# Patient Record
Sex: Female | Born: 1960 | Race: White | Hispanic: No | Marital: Married | Smoking: Former smoker
Health system: Southern US, Community
[De-identification: ages and names within clinical notes are randomized; demographics above are authoritative.]

## PROBLEM LIST (undated history)

## (undated) DIAGNOSIS — M5090 Cervical disc disorder, unspecified, unspecified cervical region: Secondary | ICD-10-CM

## (undated) DIAGNOSIS — K219 Gastro-esophageal reflux disease without esophagitis: Secondary | ICD-10-CM

## (undated) DIAGNOSIS — E039 Hypothyroidism, unspecified: Secondary | ICD-10-CM

## (undated) DIAGNOSIS — E785 Hyperlipidemia, unspecified: Secondary | ICD-10-CM

## (undated) HISTORY — DX: Gastro-esophageal reflux disease without esophagitis: K21.9

## (undated) HISTORY — PX: COLONOSCOPY: SHX174

## (undated) HISTORY — PX: CHOLECYSTECTOMY: SHX55

## (undated) HISTORY — DX: Cervical disc disorder, unspecified, unspecified cervical region: M50.90

## (undated) HISTORY — DX: Hyperlipidemia, unspecified: E78.5

---

## 2012-12-30 DIAGNOSIS — J329 Chronic sinusitis, unspecified: Secondary | ICD-10-CM | POA: Insufficient documentation

## 2014-07-20 ENCOUNTER — Ambulatory Visit (HOSPITAL_COMMUNITY)
Admission: RE | Admit: 2014-07-20 | Discharge: 2014-07-20 | Disposition: A | Discharge status: Home / Self Care | Payer: 59 | Source: Ambulatory Visit | Attending: Surgery | Admitting: Surgery

## 2014-07-20 ENCOUNTER — Other Ambulatory Visit (HOSPITAL_COMMUNITY): Payer: Self-pay | Admitting: Surgery

## 2014-07-20 DIAGNOSIS — M7989 Other specified soft tissue disorders: Secondary | ICD-10-CM | POA: Diagnosis not present

## 2014-07-20 DIAGNOSIS — M25569 Pain in unspecified knee: Secondary | ICD-10-CM | POA: Diagnosis present

## 2014-07-20 DIAGNOSIS — M79609 Pain in unspecified limb: Secondary | ICD-10-CM

## 2014-07-20 DIAGNOSIS — M25562 Pain in left knee: Secondary | ICD-10-CM

## 2015-12-26 HISTORY — PX: GALLBLADDER SURGERY: SHX652

## 2016-02-04 ENCOUNTER — Other Ambulatory Visit: Payer: Self-pay | Admitting: Family Medicine

## 2016-02-04 DIAGNOSIS — M542 Cervicalgia: Secondary | ICD-10-CM

## 2016-02-09 ENCOUNTER — Ambulatory Visit
Admission: RE | Admit: 2016-02-09 | Discharge: 2016-02-09 | Disposition: A | Discharge status: Home / Self Care | Payer: 59 | Source: Ambulatory Visit | Attending: Family Medicine | Admitting: Family Medicine

## 2016-02-09 DIAGNOSIS — M542 Cervicalgia: Secondary | ICD-10-CM

## 2016-11-27 ENCOUNTER — Other Ambulatory Visit: Payer: Self-pay | Admitting: General Surgery

## 2016-12-26 DIAGNOSIS — K8020 Calculus of gallbladder without cholecystitis without obstruction: Secondary | ICD-10-CM | POA: Diagnosis not present

## 2017-04-19 DIAGNOSIS — H5213 Myopia, bilateral: Secondary | ICD-10-CM | POA: Diagnosis not present

## 2017-04-19 DIAGNOSIS — H52223 Regular astigmatism, bilateral: Secondary | ICD-10-CM | POA: Diagnosis not present

## 2017-04-19 DIAGNOSIS — H16143 Punctate keratitis, bilateral: Secondary | ICD-10-CM | POA: Diagnosis not present

## 2017-05-30 DIAGNOSIS — J069 Acute upper respiratory infection, unspecified: Secondary | ICD-10-CM | POA: Diagnosis not present

## 2017-05-30 DIAGNOSIS — L089 Local infection of the skin and subcutaneous tissue, unspecified: Secondary | ICD-10-CM | POA: Diagnosis not present

## 2017-07-11 DIAGNOSIS — D225 Melanocytic nevi of trunk: Secondary | ICD-10-CM | POA: Diagnosis not present

## 2017-07-11 DIAGNOSIS — D2262 Melanocytic nevi of left upper limb, including shoulder: Secondary | ICD-10-CM | POA: Diagnosis not present

## 2017-07-11 DIAGNOSIS — D1801 Hemangioma of skin and subcutaneous tissue: Secondary | ICD-10-CM | POA: Diagnosis not present

## 2017-07-11 DIAGNOSIS — D485 Neoplasm of uncertain behavior of skin: Secondary | ICD-10-CM | POA: Diagnosis not present

## 2017-07-11 DIAGNOSIS — D2261 Melanocytic nevi of right upper limb, including shoulder: Secondary | ICD-10-CM | POA: Diagnosis not present

## 2017-10-03 DIAGNOSIS — E039 Hypothyroidism, unspecified: Secondary | ICD-10-CM | POA: Diagnosis not present

## 2017-10-03 DIAGNOSIS — Z1211 Encounter for screening for malignant neoplasm of colon: Secondary | ICD-10-CM | POA: Diagnosis not present

## 2017-10-03 DIAGNOSIS — R7309 Other abnormal glucose: Secondary | ICD-10-CM | POA: Diagnosis not present

## 2017-10-03 DIAGNOSIS — R140 Abdominal distension (gaseous): Secondary | ICD-10-CM | POA: Diagnosis not present

## 2017-11-29 ENCOUNTER — Encounter: Payer: Self-pay | Admitting: Family Medicine

## 2017-12-12 DIAGNOSIS — E039 Hypothyroidism, unspecified: Secondary | ICD-10-CM | POA: Diagnosis not present

## 2018-09-10 ENCOUNTER — Other Ambulatory Visit: Payer: Self-pay | Admitting: Family Medicine

## 2018-09-10 DIAGNOSIS — Z1231 Encounter for screening mammogram for malignant neoplasm of breast: Secondary | ICD-10-CM

## 2018-10-11 DIAGNOSIS — Z1231 Encounter for screening mammogram for malignant neoplasm of breast: Secondary | ICD-10-CM | POA: Diagnosis not present

## 2018-10-24 DIAGNOSIS — R928 Other abnormal and inconclusive findings on diagnostic imaging of breast: Secondary | ICD-10-CM | POA: Diagnosis not present

## 2018-10-24 DIAGNOSIS — N6315 Unspecified lump in the right breast, overlapping quadrants: Secondary | ICD-10-CM | POA: Diagnosis not present

## 2018-10-24 DIAGNOSIS — N6311 Unspecified lump in the right breast, upper outer quadrant: Secondary | ICD-10-CM | POA: Diagnosis not present

## 2018-10-28 DIAGNOSIS — R142 Eructation: Secondary | ICD-10-CM | POA: Diagnosis not present

## 2018-10-28 DIAGNOSIS — Z0000 Encounter for general adult medical examination without abnormal findings: Secondary | ICD-10-CM | POA: Diagnosis not present

## 2018-10-28 DIAGNOSIS — Z136 Encounter for screening for cardiovascular disorders: Secondary | ICD-10-CM | POA: Diagnosis not present

## 2018-10-29 DIAGNOSIS — N6091 Unspecified benign mammary dysplasia of right breast: Secondary | ICD-10-CM | POA: Diagnosis not present

## 2018-10-29 DIAGNOSIS — D241 Benign neoplasm of right breast: Secondary | ICD-10-CM | POA: Diagnosis not present

## 2018-10-29 DIAGNOSIS — R928 Other abnormal and inconclusive findings on diagnostic imaging of breast: Secondary | ICD-10-CM | POA: Diagnosis not present

## 2018-10-29 DIAGNOSIS — N6011 Diffuse cystic mastopathy of right breast: Secondary | ICD-10-CM | POA: Diagnosis not present

## 2018-10-29 DIAGNOSIS — N6315 Unspecified lump in the right breast, overlapping quadrants: Secondary | ICD-10-CM | POA: Diagnosis not present

## 2018-11-06 ENCOUNTER — Encounter: Payer: Self-pay | Admitting: Gastroenterology

## 2018-11-11 ENCOUNTER — Ambulatory Visit: Payer: Self-pay | Admitting: Surgery

## 2018-11-11 DIAGNOSIS — N6310 Unspecified lump in the right breast, unspecified quadrant: Secondary | ICD-10-CM | POA: Diagnosis not present

## 2018-11-11 DIAGNOSIS — D241 Benign neoplasm of right breast: Secondary | ICD-10-CM | POA: Diagnosis not present

## 2018-11-11 NOTE — H&P (Signed)
Oswaldo Done Documented: 11/11/2018 11:45 AM Location: [facility] Patient #: [id number] DOB: 1961/05/03 Married / Language: Cleophus Molt / Race: White Female  History of Present Illness Marcello Moores A. Chrisanne Loose MD; 11/11/2018 12:16 PM) Patient words: Patient sent at the request of Dr. Arna Snipe for abnormal mammogram. She underwent screening mammogram via [facility] that showed 2 areas to the right breast both were core biopsy. 1 showed radial scar the second showed fibroadenoma. She denies any history of breast pain, nipple discharge or the problem with either breast. She has no family history of breast cancer.  The patient is a 57 year old female.   Allergies (Tanisha A. Owens Shark, [place]; 11/11/2018 11:46 AM) No Known Drug Allergies [11/27/2016]: Allergies Reconciled  Medication History (Tanisha A. Owens Shark, [place]; 11/11/2018 11:46 AM) Omeprazole (20MG  Capsule DR, Oral) Active. Levothyroxine Sodium (175MCG Tablet, Oral) Active. Medications Reconciled    Vitals (Tanisha A. Brown RMA; 11/11/2018 11:46 AM) 11/11/2018 11:45 AM Weight: 213.8 lb Height: 67in Body Surface Area: 2.08 m Body Mass Index: 33.49 kg/m  Temp.: 98.44F  Pulse: 94 (Regular)  BP: 122/84 (Sitting, Left Arm, Standard)      Physical Exam (Khilynn Borntreger A. Donnice Nielsen MD; 11/11/2018 12:16 PM)  General Mental Status-Alert. General Appearance-Consistent with stated age. Hydration-Well hydrated. Voice-Normal.  Chest and Lung Exam Chest and lung exam reveals -quiet, even and easy respiratory effort with no use of accessory muscles and on auscultation, normal breath sounds, no adventitious sounds and normal vocal resonance. Inspection Chest Wall - Normal. Back - normal.  Breast Breast - Left-Symmetric, Non Tender, No Biopsy scars, no Dimpling, No Inflammation, No Lumpectomy scars, No Mastectomy scars, No Peau d' Orange. Breast - Right-Symmetric, Non Tender, No Biopsy scars, no Dimpling, No  Inflammation, No Lumpectomy scars, No Mastectomy scars, No Peau d' Orange. Breast Lump-No Palpable Breast Mass. Note: Mild bruising medial right breast  Neurologic Neurologic evaluation reveals -alert and oriented x 3 with no impairment of recent or remote memory. Mental Status-Normal.  Musculoskeletal Normal Exam - Left-Upper Extremity Strength Normal and Lower Extremity Strength Normal. Normal Exam - Right-Upper Extremity Strength Normal and Lower Extremity Strength Normal.  Lymphatic Head & Neck  General Head & Neck Lymphatics: Bilateral - Description - Normal. Axillary  General Axillary Region: Bilateral - Description - Normal. Tenderness - Non Tender.    Assessment & Plan (Jamine Highfill A. Kerissa Coia MD; 11/11/2018 12:17 PM)  MASS OF RIGHT BREAST ON MAMMOGRAM (N63.10) Impression: Radial scar  Discussed options of observation versus excision in the guidelines recommendations for excision of radial scar. Discussed potential upgrade risk of one and 5 for radial scar after core biopsy. She has opted for right breast seed lumpectomy. Risk of lumpectomy include bleeding, infection, seroma, more surgery, use of seed/wire, wound care, cosmetic deformity and the need for other treatments, death , blood clots, death. Pt agrees to proceed.   Patient also desires removal of the right breast fibroadenoma the same time.   BREAST FIBROADENOMA, RIGHT (D24.1) Impression: Risk of lumpectomy include bleeding, infection, seroma, more surgery, use of seed/wire, wound care, cosmetic deformity and the need for other treatments, death , blood clots, death. Pt agrees to proceed.  Current Plans You are being scheduled for surgery- Our schedulers will call you.  You should hear from our office's scheduling department within 5 working days about the location, date, and time of surgery. We try to make accommodations for patient's preferences in scheduling surgery, but sometimes the OR schedule or  the surgeon's schedule prevents [place] from making  those accommodations.  If you have not heard from our office [phone]) in 5 working days, call the office and ask for your surgeon's nurse.  If you have other questions about your diagnosis, plan, or surgery, call the office and ask for your surgeon's nurse.  Pt Education - Pamphlet Given - Breast Biopsy: discussed with patient and provided information.

## 2018-11-28 DIAGNOSIS — H52223 Regular astigmatism, bilateral: Secondary | ICD-10-CM | POA: Diagnosis not present

## 2018-11-28 DIAGNOSIS — H524 Presbyopia: Secondary | ICD-10-CM | POA: Diagnosis not present

## 2018-11-28 DIAGNOSIS — H5213 Myopia, bilateral: Secondary | ICD-10-CM | POA: Diagnosis not present

## 2018-11-29 ENCOUNTER — Other Ambulatory Visit: Payer: Self-pay | Admitting: Surgery

## 2018-11-29 DIAGNOSIS — N6310 Unspecified lump in the right breast, unspecified quadrant: Secondary | ICD-10-CM

## 2018-12-06 NOTE — Progress Notes (Signed)
New patient paperwork 

## 2018-12-10 ENCOUNTER — Telehealth: Payer: Self-pay

## 2018-12-10 ENCOUNTER — Ambulatory Visit: Payer: 59 | Admitting: Gastroenterology

## 2018-12-10 ENCOUNTER — Encounter: Payer: Self-pay | Admitting: Gastroenterology

## 2018-12-10 VITALS — BP 122/78 | HR 89 | Ht 67.0 in | Wt 214.6 lb

## 2018-12-10 DIAGNOSIS — R140 Abdominal distension (gaseous): Secondary | ICD-10-CM

## 2018-12-10 DIAGNOSIS — K219 Gastro-esophageal reflux disease without esophagitis: Secondary | ICD-10-CM | POA: Diagnosis not present

## 2018-12-10 DIAGNOSIS — Z1211 Encounter for screening for malignant neoplasm of colon: Secondary | ICD-10-CM | POA: Diagnosis not present

## 2018-12-10 DIAGNOSIS — R194 Change in bowel habit: Secondary | ICD-10-CM | POA: Diagnosis not present

## 2018-12-10 MED ORDER — COLESTIPOL HCL 1 G PO TABS: 1.0000 g | ORAL_TABLET | Freq: Two times a day (BID) | ORAL | 3 refills | Status: DC

## 2018-12-10 MED ORDER — OMEPRAZOLE 20 MG PO CPDR: 20.0000 mg | DELAYED_RELEASE_CAPSULE | Freq: Two times a day (BID) | ORAL | 5 refills | Status: DC

## 2018-12-10 MED ORDER — OMEPRAZOLE 20 MG PO CPDR: 20.0000 mg | DELAYED_RELEASE_CAPSULE | Freq: Every day | ORAL | 5 refills | Status: DC

## 2018-12-10 NOTE — Progress Notes (Signed)
HPI :  57 y/o female with a history of thyroid disorder, breast lump, GERD, referred her by Dr. Marjory Lies Marrow for discussion of colon cancer screening and GERD.  She states she had a colonoscopy on 11/05/2012, done at [facility]. Report is available for review today. She had an excellent bowel preparation. There was multiple 1-2 mm polyps in the rectosigmoid colon which were removed with cold forceps and consistent with hyperplastic polyps. Moderate internal hemorrhoids noted with small diverticula in the sigmoid colon. Exam was otherwise normal. No adenomas. She was told to consider repeat colonoscopy in 5 years. She denies any family history of colon cancer. The report on the [facility] colonoscopy suggests a family history of colon polyps, she denies any high-risk polyps in her family that she is aware of. She's had some bowel habit changes since her gallbladder was removed in January 2018. She normally has a bowel movement once a day however after eating fried or greasy foods she can have some urgency with her stools. She denies any blood in her stools. She does have some abdominal bloating and gas with distention at times. She can feel this both after eating and when she is not eating.  She otherwise endorses some reflux symptoms that have bothered her over the past 6 months. Symptoms of regurgitation and belching are the main symptoms with some acid taste in the mouth, otherwise no significant pyrosis. No upper abdominal pain or nausea or vomiting. No dysphagia. She was given a trial of omeprazole 20 mg once a day. That has improved her symptoms although she continues to have some reflux which continues to bother her at times. She never had a prior endoscopy. She denies any family history of esophageal cancer. She has a 10 year tobacco history but has quit and no longer smoking.  Of note she's had an abnormal mammogram which is leading to lumpectomy scheduled for January 7.    Past Medical History:  Diagnosis  Date  . Cervical disc disease   . GERD (gastroesophageal reflux disease)   . Hyperlipidemia   . Hyperthyroidism      Past Surgical History:  Procedure Laterality Date  . GALLBLADDER SURGERY     Family History  Problem Relation Age of Onset  . Uterine cancer Mother    Social History   Tobacco Use  . Smoking status: Former Research scientist (life sciences)  . Smokeless tobacco: Never Used  Substance Use Topics  . Alcohol use: Yes  . Drug use: Never   Current Outpatient Medications  Medication Sig Dispense Refill  . levothyroxine (SYNTHROID, LEVOTHROID) 150 MCG tablet Take 175 mcg by mouth daily before breakfast.     . omeprazole (PRILOSEC) 20 MG capsule Take 1 capsule (20 mg total) by mouth 2 (two) times daily. 60 capsule 5  . colestipol (COLESTID) 1 g tablet Take 1 tablet (1 g total) by mouth 2 (two) times daily. 60 tablet 3   No current facility-administered medications for this visit.    No Known Allergies   Review of Systems: All systems reviewed and negative except where noted in HPI.    No results found for: WBC, HGB, HCT, MCV, PLT  No results found for: CREATININE, BUN, NA, K, CL, CO2   Physical Exam: BP 122/78   Pulse 89   Ht 5\' 7"  (1.702 m)   Wt 214 lb 9.6 oz (97.3 kg)   SpO2 99%   BMI 33.61 kg/m  Constitutional: Pleasant,well-developed, female in no acute distress. HEENT: Normocephalic and atraumatic. Conjunctivae  are normal. No scleral icterus. Neck supple.  Cardiovascular: Normal rate, regular rhythm.  Pulmonary/chest: Effort normal and breath sounds normal. No wheezing, rales or rhonchi. Abdominal: Soft, nondistended, nontender.  There are no masses palpable. No hepatomegaly. Extremities: no edema Lymphadenopathy: No cervical adenopathy noted. Neurological: Alert and oriented to person place and time. Skin: Skin is warm and dry. No rashes noted. Psychiatric: Normal mood and affect. Behavior is normal.   ASSESSMENT AND PLAN: 56 year old female here for new patient  evaluation regarding following issues:  GERD - relatively recent development of symptoms within the past 6 months. On omeprazole 20 mg once a day which has helped considerably, having some breakthrough occasionally. Discussed options with her. Think a trial of increasing omeprazole to twice a day for a few weeks is reasonable to see if we can resolve her symptoms. If they continue to persist we can consider upper endoscopy. Long-term recommend the lowest dose of PPI needed to control her symptoms. She can decrease her dose as tolerated moving forward pending her course.   Bloating / altered bowel habits - she's had some urgency of her stools with loose stools since having her gallbladder out. 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 I offered her a trial of Colestid 1 g twice daily to see if that helps. She will need to separate this from her Synthroid dosing. Her triglyceride level is normal, I think she'll tolerate it. Otherwise discussed pathophysiology of intestinal gas and bloating. I recommended a trial of a low FODMAP diet and provided a handout regarding that. Bloating persists may consider a trial of probiotic or antibiotic. She agreed.  Colon cancer screening - colonoscopy in 10/2012 with benign rectosigmoid hyperplastic polyps, excellent prep, no adenomas. She denies any family history of colon polyps or colon cancer. In this light I do not feel like she warrants a colonoscopy until 10/2022. The copy of this colonoscopy report was not available until after she left the office. She will be contacted with this recommendation.   She can otherwise follow up as needed for the issues as outlined above.   Arden Cellar, MD [facility]   CC: Dr. Marjory Lies Marrow

## 2018-12-10 NOTE — Telephone Encounter (Signed)
[facility] sent a rejection of the script we sent for Omeprazole 20mg , BID.  Plan will only approve once a day.  Called and spoke to pt.  Since she is only trying BID and it may not be a long term change she agreed to supplement once a day with over-the-counter omeprazole. She agreed to call us if she determines she responds better to BID and we may try to get a Prior Auth approved.  Patient expressed agreement and understanding. Will resend script as once a day.

## 2018-12-10 NOTE — Telephone Encounter (Signed)
Called and notified pt that she will not be due for colonoscopy again until 10-2022. Also let her know that per Dr. Havery Moros, she should stop eating Monkfruit Sweetener and see if that improves her symptoms. If after a few days there is no improvement then she could resume.  He recommends she do some research to see if that is allowed on the Low FOD-Map diet. Recall placed for 10-2022.

## 2018-12-10 NOTE — Telephone Encounter (Signed)
-----   Message from Yetta Flock, MD sent at 12/10/2018  4:52 PM EST ----- Regarding: patient recall colon Jan would you mind calling this patient to let her know I do not think she warrants a colonoscopy until 10/2022 based on the report of her last colonoscopy, unless she has a strong FH of colon polyps or colon cancer (she denied that in clinic today). Can you place a recall for her? Thanks much

## 2018-12-10 NOTE — Patient Instructions (Signed)
If you are age 56 or older, your body mass index should be between 23-30. Your Body mass index is 33.61 kg/m. If this is out of the aforementioned range listed, please consider follow up with your Primary Care Provider.  If you are age 61 or younger, your body mass index should be between 19-25. Your Body mass index is 33.61 kg/m. If this is out of the aformentioned range listed, please consider follow up with your Primary Care Provider.   We have sent the following medications to your pharmacy for you to pick up at your convenience: Omeprazole 20mg : Take twice a day Colestid 1g: take twice a day  (please discuss the timing of taking this medication with the pharmacist)  We are giving you a Low FOD-Map diet to follow.  Thank you for entrusting me with your care and for choosing [facility], Dr. Breese Cellar

## 2018-12-23 ENCOUNTER — Encounter (HOSPITAL_BASED_OUTPATIENT_CLINIC_OR_DEPARTMENT_OTHER): Payer: Self-pay

## 2018-12-23 ENCOUNTER — Other Ambulatory Visit: Payer: Self-pay

## 2018-12-25 DIAGNOSIS — D229 Melanocytic nevi, unspecified: Secondary | ICD-10-CM

## 2018-12-25 HISTORY — PX: BREAST BIOPSY: SHX20

## 2018-12-25 HISTORY — DX: Melanocytic nevi, unspecified: D22.9

## 2018-12-26 DIAGNOSIS — E039 Hypothyroidism, unspecified: Secondary | ICD-10-CM | POA: Diagnosis not present

## 2018-12-31 ENCOUNTER — Ambulatory Visit
Admission: RE | Admit: 2018-12-31 | Discharge: 2018-12-31 | Disposition: A | Discharge status: Home / Self Care | Payer: 59 | Source: Ambulatory Visit | Attending: Surgery | Admitting: Surgery

## 2018-12-31 DIAGNOSIS — N6310 Unspecified lump in the right breast, unspecified quadrant: Secondary | ICD-10-CM

## 2018-12-31 DIAGNOSIS — R928 Other abnormal and inconclusive findings on diagnostic imaging of breast: Secondary | ICD-10-CM | POA: Diagnosis not present

## 2018-12-31 NOTE — Progress Notes (Signed)
Patient given ENSURE pre-surgery drink to consume at 0500am DOS.  Patient verbalized understanding.

## 2019-01-01 ENCOUNTER — Ambulatory Visit
Admission: RE | Admit: 2019-01-01 | Discharge: 2019-01-01 | Disposition: A | Discharge status: Home / Self Care | Payer: 59 | Source: Ambulatory Visit | Attending: Surgery | Admitting: Surgery

## 2019-01-01 ENCOUNTER — Ambulatory Visit (HOSPITAL_BASED_OUTPATIENT_CLINIC_OR_DEPARTMENT_OTHER)
Admission: RE | Admit: 2019-01-01 | Discharge: 2019-01-01 | Disposition: A | Discharge status: Home / Self Care | Payer: 59 | Attending: Surgery | Admitting: Surgery

## 2019-01-01 ENCOUNTER — Encounter (HOSPITAL_BASED_OUTPATIENT_CLINIC_OR_DEPARTMENT_OTHER)
Admission: RE | Disposition: A | Discharge status: Home / Self Care | Payer: Self-pay | Source: Home / Self Care | Attending: Surgery

## 2019-01-01 ENCOUNTER — Other Ambulatory Visit: Payer: Self-pay

## 2019-01-01 ENCOUNTER — Ambulatory Visit (HOSPITAL_BASED_OUTPATIENT_CLINIC_OR_DEPARTMENT_OTHER): Payer: 59 | Admitting: Anesthesiology

## 2019-01-01 ENCOUNTER — Encounter (HOSPITAL_BASED_OUTPATIENT_CLINIC_OR_DEPARTMENT_OTHER): Payer: Self-pay | Admitting: *Deleted

## 2019-01-01 DIAGNOSIS — K219 Gastro-esophageal reflux disease without esophagitis: Secondary | ICD-10-CM | POA: Diagnosis not present

## 2019-01-01 DIAGNOSIS — E039 Hypothyroidism, unspecified: Secondary | ICD-10-CM | POA: Diagnosis not present

## 2019-01-01 DIAGNOSIS — Z79890 Hormone replacement therapy (postmenopausal): Secondary | ICD-10-CM | POA: Insufficient documentation

## 2019-01-01 DIAGNOSIS — N62 Hypertrophy of breast: Secondary | ICD-10-CM | POA: Diagnosis not present

## 2019-01-01 DIAGNOSIS — N6310 Unspecified lump in the right breast, unspecified quadrant: Secondary | ICD-10-CM

## 2019-01-01 DIAGNOSIS — R928 Other abnormal and inconclusive findings on diagnostic imaging of breast: Secondary | ICD-10-CM | POA: Diagnosis not present

## 2019-01-01 DIAGNOSIS — Z87891 Personal history of nicotine dependence: Secondary | ICD-10-CM | POA: Insufficient documentation

## 2019-01-01 DIAGNOSIS — D241 Benign neoplasm of right breast: Secondary | ICD-10-CM | POA: Diagnosis not present

## 2019-01-01 DIAGNOSIS — Z79899 Other long term (current) drug therapy: Secondary | ICD-10-CM | POA: Diagnosis not present

## 2019-01-01 DIAGNOSIS — N6081 Other benign mammary dysplasias of right breast: Secondary | ICD-10-CM | POA: Diagnosis not present

## 2019-01-01 DIAGNOSIS — N6489 Other specified disorders of breast: Secondary | ICD-10-CM | POA: Insufficient documentation

## 2019-01-01 HISTORY — PX: BREAST LUMPECTOMY WITH RADIOACTIVE SEED LOCALIZATION: SHX6424

## 2019-01-01 HISTORY — DX: Hypothyroidism, unspecified: E03.9

## 2019-01-01 SURGERY — BREAST LUMPECTOMY WITH RADIOACTIVE SEED LOCALIZATION
Anesthesia: General | Site: Breast | Laterality: Right

## 2019-01-01 MED ORDER — ACETAMINOPHEN 500 MG PO TABS
ORAL_TABLET | ORAL | Status: AC
  Filled 2019-01-01: qty 2

## 2019-01-01 MED ORDER — LIDOCAINE HCL (CARDIAC) PF 100 MG/5ML IV SOSY
PREFILLED_SYRINGE | INTRAVENOUS | Status: DC | PRN
  Administered 2019-01-01: 80 mg via INTRAVENOUS

## 2019-01-01 MED ORDER — CELECOXIB 200 MG PO CAPS
ORAL_CAPSULE | ORAL | Status: AC
  Filled 2019-01-01: qty 1

## 2019-01-01 MED ORDER — OXYCODONE HCL 5 MG PO TABS: 5.0000 mg | ORAL_TABLET | Freq: Four times a day (QID) | ORAL | 0 refills | Status: DC | PRN

## 2019-01-01 MED ORDER — ONDANSETRON HCL 4 MG/2ML IJ SOLN
INTRAMUSCULAR | Status: DC | PRN
  Administered 2019-01-01: 4 mg via INTRAVENOUS

## 2019-01-01 MED ORDER — FENTANYL CITRATE (PF) 100 MCG/2ML IJ SOLN: 50.0000 ug | INTRAMUSCULAR | Status: DC | PRN

## 2019-01-01 MED ORDER — FENTANYL CITRATE (PF) 100 MCG/2ML IJ SOLN: 25.0000 ug | INTRAMUSCULAR | Status: DC | PRN

## 2019-01-01 MED ORDER — GABAPENTIN 300 MG PO CAPS
300.0000 mg | ORAL_CAPSULE | ORAL | Status: AC
  Administered 2019-01-01: 300 mg via ORAL

## 2019-01-01 MED ORDER — PROMETHAZINE HCL 25 MG/ML IJ SOLN: 6.2500 mg | INTRAMUSCULAR | Status: DC | PRN

## 2019-01-01 MED ORDER — CELECOXIB 200 MG PO CAPS
200.0000 mg | ORAL_CAPSULE | ORAL | Status: AC
  Administered 2019-01-01: 200 mg via ORAL

## 2019-01-01 MED ORDER — OXYCODONE HCL 5 MG PO TABS: 5.0000 mg | ORAL_TABLET | Freq: Once | ORAL | Status: DC | PRN

## 2019-01-01 MED ORDER — SODIUM CHLORIDE (PF) 0.9 % IJ SOLN
INTRAMUSCULAR | Status: AC
  Filled 2019-01-01: qty 10

## 2019-01-01 MED ORDER — CEFAZOLIN SODIUM-DEXTROSE 2-4 GM/100ML-% IV SOLN
INTRAVENOUS | Status: AC
  Filled 2019-01-01: qty 100

## 2019-01-01 MED ORDER — MIDAZOLAM HCL 2 MG/2ML IJ SOLN
INTRAMUSCULAR | Status: AC
  Filled 2019-01-01: qty 2

## 2019-01-01 MED ORDER — MIDAZOLAM HCL 5 MG/5ML IJ SOLN
INTRAMUSCULAR | Status: DC | PRN
  Administered 2019-01-01: 2 mg via INTRAVENOUS

## 2019-01-01 MED ORDER — MIDAZOLAM HCL 2 MG/2ML IJ SOLN: 1.0000 mg | INTRAMUSCULAR | Status: DC | PRN

## 2019-01-01 MED ORDER — FENTANYL CITRATE (PF) 100 MCG/2ML IJ SOLN
INTRAMUSCULAR | Status: DC | PRN
  Administered 2019-01-01: 100 ug via INTRAVENOUS

## 2019-01-01 MED ORDER — LACTATED RINGERS IV SOLN: INTRAVENOUS | Status: DC

## 2019-01-01 MED ORDER — CHLORHEXIDINE GLUCONATE CLOTH 2 % EX PADS: 6.0000 | MEDICATED_PAD | Freq: Once | CUTANEOUS | Status: DC

## 2019-01-01 MED ORDER — PROPOFOL 10 MG/ML IV BOLUS
INTRAVENOUS | Status: DC | PRN
  Administered 2019-01-01: 150 mg via INTRAVENOUS

## 2019-01-01 MED ORDER — BUPIVACAINE-EPINEPHRINE (PF) 0.25% -1:200000 IJ SOLN
INTRAMUSCULAR | Status: AC
  Filled 2019-01-01: qty 30

## 2019-01-01 MED ORDER — PROPOFOL 10 MG/ML IV BOLUS
INTRAVENOUS | Status: AC
  Filled 2019-01-01: qty 20

## 2019-01-01 MED ORDER — CEFAZOLIN SODIUM-DEXTROSE 2-4 GM/100ML-% IV SOLN
2.0000 g | INTRAVENOUS | Status: AC
  Administered 2019-01-01: 2 g via INTRAVENOUS

## 2019-01-01 MED ORDER — FENTANYL CITRATE (PF) 100 MCG/2ML IJ SOLN
INTRAMUSCULAR | Status: AC
  Filled 2019-01-01: qty 2

## 2019-01-01 MED ORDER — MEPERIDINE HCL 25 MG/ML IJ SOLN: 6.2500 mg | INTRAMUSCULAR | Status: DC | PRN

## 2019-01-01 MED ORDER — BUPIVACAINE-EPINEPHRINE (PF) 0.25% -1:200000 IJ SOLN
INTRAMUSCULAR | Status: DC | PRN
  Administered 2019-01-01: 28 mL

## 2019-01-01 MED ORDER — SCOPOLAMINE 1 MG/3DAYS TD PT72: 1.0000 | MEDICATED_PATCH | Freq: Once | TRANSDERMAL | Status: DC | PRN

## 2019-01-01 MED ORDER — METHYLENE BLUE 0.5 % INJ SOLN
INTRAVENOUS | Status: AC
  Filled 2019-01-01: qty 10

## 2019-01-01 MED ORDER — IBUPROFEN 800 MG PO TABS: 800.0000 mg | ORAL_TABLET | Freq: Three times a day (TID) | ORAL | 0 refills | Status: DC | PRN

## 2019-01-01 MED ORDER — ACETAMINOPHEN 500 MG PO TABS
1000.0000 mg | ORAL_TABLET | ORAL | Status: AC
  Administered 2019-01-01: 1000 mg via ORAL

## 2019-01-01 MED ORDER — ONDANSETRON HCL 4 MG/2ML IJ SOLN
INTRAMUSCULAR | Status: AC
  Filled 2019-01-01: qty 2

## 2019-01-01 MED ORDER — DEXAMETHASONE SODIUM PHOSPHATE 10 MG/ML IJ SOLN
INTRAMUSCULAR | Status: AC
  Filled 2019-01-01: qty 1

## 2019-01-01 MED ORDER — LACTATED RINGERS IV SOLN
INTRAVENOUS | Status: DC
  Administered 2019-01-01 (×2): via INTRAVENOUS

## 2019-01-01 MED ORDER — DEXAMETHASONE SODIUM PHOSPHATE 10 MG/ML IJ SOLN
INTRAMUSCULAR | Status: DC | PRN
  Administered 2019-01-01: 4 mg via INTRAVENOUS

## 2019-01-01 MED ORDER — OXYCODONE HCL 5 MG/5ML PO SOLN: 5.0000 mg | Freq: Once | ORAL | Status: DC | PRN

## 2019-01-01 MED ORDER — GABAPENTIN 300 MG PO CAPS
ORAL_CAPSULE | ORAL | Status: AC
  Filled 2019-01-01: qty 1

## 2019-01-01 SURGICAL SUPPLY — 48 items
APPLIER CLIP 9.375 MED OPEN (MISCELLANEOUS)
BINDER BREAST LRG (GAUZE/BANDAGES/DRESSINGS) IMPLANT
BINDER BREAST MEDIUM (GAUZE/BANDAGES/DRESSINGS) IMPLANT
BINDER BREAST XLRG (GAUZE/BANDAGES/DRESSINGS) IMPLANT
BINDER BREAST XXLRG (GAUZE/BANDAGES/DRESSINGS) ×1 IMPLANT
BLADE SURG 15 STRL LF DISP TIS (BLADE) ×1 IMPLANT
BLADE SURG 15 STRL SS (BLADE) ×1
CANISTER SUC SOCK COL 7IN (MISCELLANEOUS) IMPLANT
CANISTER SUCT 1200ML W/VALVE (MISCELLANEOUS) IMPLANT
CHLORAPREP W/TINT 26ML (MISCELLANEOUS) ×2 IMPLANT
CLIP APPLIE 9.375 MED OPEN (MISCELLANEOUS) IMPLANT
COVER BACK TABLE 60X90IN (DRAPES) ×2 IMPLANT
COVER MAYO STAND STRL (DRAPES) ×2 IMPLANT
COVER PROBE W GEL 5X96 (DRAPES) ×2 IMPLANT
COVER WAND RF STERILE (DRAPES) IMPLANT
DECANTER SPIKE VIAL GLASS SM (MISCELLANEOUS) IMPLANT
DERMABOND ADVANCED (GAUZE/BANDAGES/DRESSINGS) ×1
DERMABOND ADVANCED .7 DNX12 (GAUZE/BANDAGES/DRESSINGS) ×1 IMPLANT
DRAPE LAPAROSCOPIC ABDOMINAL (DRAPES) IMPLANT
DRAPE LAPAROTOMY 100X72 PEDS (DRAPES) ×2 IMPLANT
DRAPE UTILITY XL STRL (DRAPES) ×2 IMPLANT
ELECT COATED BLADE 2.86 ST (ELECTRODE) ×2 IMPLANT
ELECT REM PT RETURN 9FT ADLT (ELECTROSURGICAL) ×2
ELECTRODE REM PT RTRN 9FT ADLT (ELECTROSURGICAL) ×1 IMPLANT
GLOVE BIOGEL PI IND STRL 8 (GLOVE) ×1 IMPLANT
GLOVE BIOGEL PI INDICATOR 8 (GLOVE) ×1
GLOVE ECLIPSE 8.0 STRL XLNG CF (GLOVE) ×2 IMPLANT
GOWN STRL REUS W/ TWL LRG LVL3 (GOWN DISPOSABLE) ×2 IMPLANT
GOWN STRL REUS W/TWL LRG LVL3 (GOWN DISPOSABLE) ×2
HEMOSTAT ARISTA ABSORB 3G PWDR (MISCELLANEOUS) IMPLANT
HEMOSTAT SNOW SURGICEL 2X4 (HEMOSTASIS) IMPLANT
KIT MARKER MARGIN INK (KITS) ×2 IMPLANT
NDL HYPO 25X1 1.5 SAFETY (NEEDLE) ×1 IMPLANT
NEEDLE HYPO 25X1 1.5 SAFETY (NEEDLE) ×2 IMPLANT
NS IRRIG 1000ML POUR BTL (IV SOLUTION) ×2 IMPLANT
PACK BASIN DAY SURGERY FS (CUSTOM PROCEDURE TRAY) ×2 IMPLANT
PENCIL BUTTON HOLSTER BLD 10FT (ELECTRODE) ×2 IMPLANT
SLEEVE SCD COMPRESS KNEE MED (MISCELLANEOUS) ×2 IMPLANT
SPONGE LAP 4X18 RFD (DISPOSABLE) ×3 IMPLANT
SUT MNCRL AB 4-0 PS2 18 (SUTURE) ×2 IMPLANT
SUT SILK 2 0 SH (SUTURE) IMPLANT
SUT VICRYL 3-0 CR8 SH (SUTURE) ×2 IMPLANT
SYR CONTROL 10ML LL (SYRINGE) ×2 IMPLANT
TOWEL GREEN STERILE FF (TOWEL DISPOSABLE) ×2 IMPLANT
TOWEL OR NON WOVEN STRL DISP B (DISPOSABLE) ×2 IMPLANT
TRAY FAXITRON CT DISP (TRAY / TRAY PROCEDURE) ×3 IMPLANT
TUBE CONNECTING 20X1/4 (TUBING) ×1 IMPLANT
YANKAUER SUCT BULB TIP NO VENT (SUCTIONS) ×1 IMPLANT

## 2019-01-01 NOTE — Anesthesia Preprocedure Evaluation (Addendum)
Anesthesia Evaluation  Patient identified by MRN, date of birth, ID band Patient awake    Reviewed: Allergy & Precautions, NPO status , Patient's Chart, lab work & pertinent test results  Airway Mallampati: III  TM Distance: >3 FB Neck ROM: Full    Dental  (+) Teeth Intact, Dental Advisory Given   Pulmonary former smoker,    breath sounds clear to auscultation       Cardiovascular negative cardio ROS   Rhythm:Regular Rate:Normal     Neuro/Psych    GI/Hepatic Neg liver ROS, GERD  Medicated,  Endo/Other  Hypothyroidism   Renal/GU negative Renal ROS     Musculoskeletal negative musculoskeletal ROS (+)   Abdominal Normal abdominal exam  (+)   Peds  Hematology negative hematology ROS (+)   Anesthesia Other Findings - HLD  Reproductive/Obstetrics                            Anesthesia Physical Anesthesia Plan  ASA: II  Anesthesia Plan: General   Post-op Pain Management:    Induction: Intravenous  PONV Risk Score and Plan: 4 or greater and Ondansetron, Dexamethasone, Midazolam and Scopolamine patch - Pre-op  Airway Management Planned: LMA  Additional Equipment: None  Intra-op Plan:   Post-operative Plan: Extubation in OR  Informed Consent: I have reviewed the patients History and Physical, chart, labs and discussed the procedure including the risks, benefits and alternatives for the proposed anesthesia with the patient or authorized representative who has indicated his/her understanding and acceptance.   Dental advisory given  Plan Discussed with: CRNA  Anesthesia Plan Comments:        Anesthesia Quick Evaluation

## 2019-01-01 NOTE — Anesthesia Postprocedure Evaluation (Signed)
Anesthesia Post Note  Patient: Leah Perry  Procedure(s) Performed: RIGHT BREAST RADIOACTIVE SEED LOCALIZATION LUMPECTOMY (2 seeds--2 sites) (Right Breast)     Patient location during evaluation: PACU Anesthesia Type: General Level of consciousness: awake and alert Pain management: pain level controlled Vital Signs Assessment: post-procedure vital signs reviewed and stable Respiratory status: spontaneous breathing, nonlabored ventilation, respiratory function stable and patient connected to nasal cannula oxygen Cardiovascular status: blood pressure returned to baseline and stable Postop Assessment: no apparent nausea or vomiting Anesthetic complications: no    Last Vitals:  Vitals:   01/01/19 1015 01/01/19 1045  BP: 109/69 119/76  Pulse: 73 87  Resp: 12 16  Temp:  36.6 C  SpO2: 96% 97%    Last Pain:  Vitals:   01/01/19 1057  PainSc: 2                  Effie Berkshire

## 2019-01-01 NOTE — H&P (Signed)
Leah Perry  Location: [facility] Patient #: [id number] DOB: Jan 12, 1961 Married / Language: English / Race: White Female  History of Present Illness  Patient words: Patient sent at the request of Dr. Arna Snipe for abnormal mammogram. She underwent screening mammogram via [facility] that showed 2 areas to the right breast both were core biopsy. 1 showed radial scar the second showed fibroadenoma. She denies any history of breast pain, nipple discharge or the problem with either breast. She has no family history of breast cancer.  The patient is a 58 year old female.   Allergies  No Known Drug Allergies [11/27/2016]: Allergies Reconciled  Medication HistoryOmeprazole (20MG  Capsule DR, Oral) Active. Levothyroxine Sodium (175MCG Tablet, Oral) Active. Medications Reconciled    Vitals 11/11/2018 11:45 AM Weight: 213.8 lb Height: 67in Body Surface Area: 2.08 m Body Mass Index: 33.49 kg/m  Temp.: 98.28F  Pulse: 94 (Regular)  BP: 122/84 (Sitting, Left Arm, Standard)      Physical Exam  General Mental Status-Alert. General Appearance-Consistent with stated age. Hydration-Well hydrated. Voice-Normal.  Chest and Lung Exam Chest and lung exam reveals -quiet, even and easy respiratory effort with no use of accessory muscles and on auscultation, normal breath sounds, no adventitious sounds and normal vocal resonance. Inspection Chest Wall - Normal. Back - normal.  Breast Breast - Left-Symmetric, Non Tender, No Biopsy scars, no Dimpling, No Inflammation, No Lumpectomy scars, No Mastectomy scars, No Peau d' Orange. Breast - Right-Symmetric, Non Tender, No Biopsy scars, no Dimpling, No Inflammation, No Lumpectomy scars, No Mastectomy scars, No Peau d' Orange. Breast Lump-No Palpable Breast Mass. Note: Mild bruising medial right breast  Neurologic Neurologic evaluation reveals -alert and oriented x 3 with no  impairment of recent or remote memory. Mental Status-Normal.  Musculoskeletal Normal Exam - Left-Upper Extremity Strength Normal and Lower Extremity Strength Normal. Normal Exam - Right-Upper Extremity Strength Normal and Lower Extremity Strength Normal.  Lymphatic Head & Neck  General Head & Neck Lymphatics: Bilateral - Description - Normal. Axillary  General Axillary Region: Bilateral - Description - Normal. Tenderness - Non Tender.    Assessment & Plan  MASS OF RIGHT BREAST ON MAMMOGRAM (N63.10) Impression: Radial scar  Discussed options of observation versus excision in the guidelines recommendations for excision of radial scar. Discussed potential upgrade risk of one and 5 for radial scar after core biopsy. She has opted for right breast seed lumpectomy. Risk of lumpectomy include bleeding, infection, seroma, more surgery, use of seed/wire, wound care, cosmetic deformity and the need for other treatments, death , blood clots, death. Pt agrees to proceed.   Patient also desires removal of the right breast fibroadenoma the same time.   BREAST FIBROADENOMA, RIGHT (D24.1) Impression: Risk of lumpectomy include bleeding, infection, seroma, more surgery, use of seed/wire, wound care, cosmetic deformity and the need for other treatments, death , blood clots, death. Pt agrees to proceed.  Current Plans You are being scheduled for surgery- Our schedulers will call you.  You should hear from our office's scheduling department within 5 working days about the location, date, and time of surgery. We try to make accommodations for patient's preferences in scheduling surgery, but sometimes the OR schedule or the surgeon's schedule prevents [place] from making those accommodations.  If you have not heard from our office [phone]) in 5 working days, call the office and ask for your surgeon's nurse.  If you have other questions about your diagnosis, plan, or  surgery, call the office and ask for your  surgeon's nurse.  Pt Education - Pamphlet Given - Breast Biopsy: discussed with patient and provided information.

## 2019-01-01 NOTE — Interval H&P Note (Signed)
History and Physical Interval Note:  01/01/2019 7:59 AM  Leah Perry  has presented today for surgery, with the diagnosis of right breast mass/ fibroadenoma  The various methods of treatment have been discussed with the patient and family. After consideration of risks, benefits and other options for treatment, the patient has consented to  Procedure(s): RIGHT BREAST RADIOACTIVE SEED LOCALIZATION LUMPECTOMY x2 (Right) as a surgical intervention .  The patient's history has been reviewed, patient examined, no change in status, stable for surgery.  I have reviewed the patient's chart and labs.  Questions were answered to the patient's satisfaction.     [place]

## 2019-01-01 NOTE — Addendum Note (Signed)
Addendum  created 01/01/19 1216 by Jonna Munro, CRNA   Intraprocedure Flowsheets edited

## 2019-01-01 NOTE — Anesthesia Procedure Notes (Signed)
Procedure Name: LMA Insertion Date/Time: 01/01/2019 8:28 AM Performed by: Jonna Munro, CRNA Pre-anesthesia Checklist: Patient identified, Emergency Drugs available, Suction available, Patient being monitored and Timeout performed Patient Re-evaluated:Patient Re-evaluated prior to induction Oxygen Delivery Method: Circle system utilized Preoxygenation: Pre-oxygenation with 100% oxygen Induction Type: IV induction LMA: LMA inserted LMA Size: 4.0 Number of attempts: 1 Placement Confirmation: positive ETCO2 and breath sounds checked- equal and bilateral Tube secured with: Tape Dental Injury: Teeth and Oropharynx as per pre-operative assessment

## 2019-01-01 NOTE — Op Note (Signed)
Preoperative diagnosis: Right breast fibroadenoma upper outer quadrant and right breast complex sclerosing lesion medial  Postoperative diagnosis: Same  Procedure: Right breast seed localized lumpectomy x2  Surgeon: Erroll Luna, MD  Anesthesia: LMA with local  EBL: 10 cc  Specimens #1 medial right breast lesion and #2 superior right breast lesion both pathology.  Faxitron verified seed and clip in both specimens.  Drains: None  IV fluids: Per anesthesia record  Indications for procedure: The patient is a 58 year old female who on screening mammography had 2 densities detected.  She underwent diagnostic and subsequent biopsy of both lesions.  One came back as a fibroadenoma the second came back as a complex sclerosing lesion.  The patient opted for removal both of these.The procedure has been discussed with the patient. Alternatives to surgery have been discussed with the patient.  Risks of surgery include bleeding,  Infection,  Seroma formation, death,  and the need for further surgery.   The patient understands and wishes to proceed.  Description of procedure: The patient was met in the holding area.  Neoprobe was used to identify both seeds.  Questions were identified.  She was taken the operating.  She is placed supine upon the operating table.  After induction of general seizure, the right breast was prepped and draped in sterile fashion.  Timeout was done.  Curvilinear incision was made along the medial border of the nipple.  Local anesthetic of 0.25% Sensorcaine was infiltrated with epinephrine.  The lesion was excised with gross negative margins.  Faxitron revealed both seed and clip to be in the specimen.  The second lesion was identified.  This was in the upper right breast.  Curvilinear incision was made about the superior border of the nipple were complex.  Dissection was carried around all tissue around the seed and clip were excised with grossly negative margins.  Hemostasis was  achieved.  Local anesthetic infiltrated.  Both wounds closed with 3-0 Vicryl and 4-0 Monocryl.  All final counts found to be correct.  Dermabond was placed was breast binder.  The patient was awoke extubated taken to recovery in satisfactory condition.

## 2019-01-01 NOTE — Discharge Instructions (Signed)
[facility] °Office Phone Number [phone] ° °BREAST BIOPSY/ PARTIAL MASTECTOMY: POST OP INSTRUCTIONS ° °Always review your discharge instruction sheet given to you by the facility where your surgery was performed. ° °IF YOU HAVE DISABILITY OR FAMILY LEAVE FORMS, YOU MUST BRING THEM TO THE OFFICE FOR PROCESSING.  DO NOT GIVE THEM TO YOUR DOCTOR. ° °1. A prescription for pain medication may be given to you upon discharge.  Take your pain medication as prescribed, if needed.  If narcotic pain medicine is not needed, then you may take acetaminophen (Tylenol) or ibuprofen (Advil) as needed. °2. Take your usually prescribed medications unless otherwise directed °3. If you need a refill on your pain medication, please contact your pharmacy.  They will contact our office to request authorization.  Prescriptions will not be filled after 5pm or on week-ends. °4. You should eat very light the first 24 hours after surgery, such as soup, crackers, pudding, etc.  Resume your normal diet the day after surgery. °5. Most patients will experience some swelling and bruising in the breast.  Ice packs and a good support bra will help.  Swelling and bruising can take several days to resolve.  °6. It is common to experience some constipation if taking pain medication after surgery.  Increasing fluid intake and taking a stool softener will usually help or prevent this problem from occurring.  A mild laxative (Milk of Magnesia or Miralax) should be taken according to package directions if there are no bowel movements after 48 hours. °7. Unless discharge instructions indicate otherwise, you may remove your bandages 24-48 hours after surgery, and you may shower at that time.  You may have steri-strips (small skin tapes) in place directly over the incision.  These strips should be left on the skin for 7-10 days.  If your surgeon used skin glue on the incision, you may shower in 24 hours.  The glue will flake off over the  next 2-3 weeks.  Any sutures or staples will be removed at the office during your follow-up visit. °8. ACTIVITIES:  You may resume regular daily activities (gradually increasing) beginning the next day.  Wearing a good support bra or sports bra minimizes pain and swelling.  You may have sexual intercourse when it is comfortable. °a. You may drive when you no longer are taking prescription pain medication, you can comfortably wear a seatbelt, and you can safely maneuver your car and apply brakes. °b. RETURN TO WORK:  ______________________________________________________________________________________ °9. You should see your doctor in the office for a follow-up appointment approximately two weeks after your surgery.  Your doctor’s nurse will typically make your follow-up appointment when she calls you with your pathology report.  Expect your pathology report 2-3 business days after your surgery.  You may call to check if you do not hear from us after three days. °10. OTHER INSTRUCTIONS: _______________________________________________________________________________________________ _____________________________________________________________________________________________________________________________________ °_____________________________________________________________________________________________________________________________________ °_____________________________________________________________________________________________________________________________________ ° °WHEN TO CALL YOUR DOCTOR: °1. Fever over 101.0 °2. Nausea and/or vomiting. °3. Extreme swelling or bruising. °4. Continued bleeding from incision. °5. Increased pain, redness, or drainage from the incision. ° °The clinic staff is available to answer your questions during regular business hours.  Please don’t hesitate to call and ask to speak to one of the nurses for clinical concerns.  If you have a medical emergency, go to the nearest  emergency room or call 911.  A surgeon from [facility] is always on call at the hospital. ° °For further questions, please visit [email or web address]  ° ° ° ° °  Post Anesthesia Home Care Instructions ° °Activity: °Get plenty of rest for the remainder of the day. A responsible individual must stay with you for 24 hours following the procedure.  °For the next 24 hours, DO NOT: °-Drive a car °-Operate machinery °-Drink alcoholic beverages °-Take any medication unless instructed by your physician °-Make any legal decisions or sign important papers. ° °Meals: °Start with liquid foods such as gelatin or soup. Progress to regular foods as tolerated. Avoid greasy, spicy, heavy foods. If nausea and/or vomiting occur, drink only clear liquids until the nausea and/or vomiting subsides. Call your physician if vomiting continues. ° °Special Instructions/Symptoms: °Your throat may feel dry or sore from the anesthesia or the breathing tube placed in your throat during surgery. If this causes discomfort, gargle with warm salt water. The discomfort should disappear within 24 hours. ° °If you had a scopolamine patch placed behind your ear for the management of post- operative nausea and/or vomiting: ° °1. The medication in the patch is effective for 72 hours, after which it should be removed.  Wrap patch in a tissue and discard in the trash. Wash hands thoroughly with soap and water. °2. You may remove the patch earlier than 72 hours if you experience unpleasant side effects which may include dry mouth, dizziness or visual disturbances. °3. Avoid touching the patch. Wash your hands with soap and water after contact with the patch. °  ° °

## 2019-01-01 NOTE — Transfer of Care (Signed)
Immediate Anesthesia Transfer of Care Note  Patient: Leah Perry  Procedure(s) Performed: RIGHT BREAST RADIOACTIVE SEED LOCALIZATION LUMPECTOMY (2 seeds--2 sites) (Right Breast)  Patient Location: PACU  Anesthesia Type:General  Level of Consciousness: awake, alert  and oriented  Airway & Oxygen Therapy: Patient Spontanous Breathing and Patient connected to face mask oxygen  Post-op Assessment: Report given to RN and Post -op Vital signs reviewed and stable  Post vital signs: Reviewed and stable  Last Vitals:  Vitals Value Taken Time  BP    Temp    Pulse    Resp    SpO2      Last Pain:  Vitals:   01/01/19 0710  PainSc: 0-No pain         Complications: No apparent anesthesia complications

## 2019-01-02 ENCOUNTER — Encounter (HOSPITAL_BASED_OUTPATIENT_CLINIC_OR_DEPARTMENT_OTHER): Payer: Self-pay | Admitting: Surgery

## 2019-02-14 ENCOUNTER — Other Ambulatory Visit: Payer: Self-pay

## 2019-02-14 DIAGNOSIS — D2372 Other benign neoplasm of skin of left lower limb, including hip: Secondary | ICD-10-CM | POA: Diagnosis not present

## 2019-02-14 DIAGNOSIS — D235 Other benign neoplasm of skin of trunk: Secondary | ICD-10-CM | POA: Diagnosis not present

## 2019-02-14 DIAGNOSIS — D485 Neoplasm of uncertain behavior of skin: Secondary | ICD-10-CM | POA: Diagnosis not present

## 2019-03-08 ENCOUNTER — Other Ambulatory Visit: Payer: Self-pay | Admitting: Gastroenterology

## 2019-05-15 ENCOUNTER — Other Ambulatory Visit: Payer: Self-pay | Admitting: Dermatology

## 2019-12-10 ENCOUNTER — Other Ambulatory Visit: Payer: Self-pay | Admitting: Family Medicine

## 2019-12-10 DIAGNOSIS — Z1231 Encounter for screening mammogram for malignant neoplasm of breast: Secondary | ICD-10-CM

## 2020-01-01 ENCOUNTER — Other Ambulatory Visit: Payer: Self-pay | Admitting: Obstetrics and Gynecology

## 2020-01-28 ENCOUNTER — Ambulatory Visit: Payer: 59

## 2020-02-03 ENCOUNTER — Other Ambulatory Visit: Payer: Self-pay

## 2020-02-03 ENCOUNTER — Ambulatory Visit
Admission: RE | Admit: 2020-02-03 | Discharge: 2020-02-03 | Disposition: A | Discharge status: Home / Self Care | Payer: 59 | Source: Ambulatory Visit | Attending: Family Medicine | Admitting: Family Medicine

## 2020-02-03 DIAGNOSIS — Z1231 Encounter for screening mammogram for malignant neoplasm of breast: Secondary | ICD-10-CM

## 2020-02-03 DIAGNOSIS — H903 Sensorineural hearing loss, bilateral: Secondary | ICD-10-CM | POA: Insufficient documentation

## 2020-02-03 DIAGNOSIS — H9319 Tinnitus, unspecified ear: Secondary | ICD-10-CM | POA: Insufficient documentation

## 2020-02-06 ENCOUNTER — Other Ambulatory Visit: Payer: Self-pay | Admitting: Family Medicine

## 2020-02-06 DIAGNOSIS — R928 Other abnormal and inconclusive findings on diagnostic imaging of breast: Secondary | ICD-10-CM

## 2020-02-18 ENCOUNTER — Other Ambulatory Visit: Payer: Self-pay

## 2020-02-18 ENCOUNTER — Ambulatory Visit: Payer: 59

## 2020-02-18 ENCOUNTER — Ambulatory Visit
Admission: RE | Admit: 2020-02-18 | Discharge: 2020-02-18 | Disposition: A | Discharge status: Home / Self Care | Payer: 59 | Source: Ambulatory Visit | Attending: Family Medicine | Admitting: Family Medicine

## 2020-02-18 DIAGNOSIS — R928 Other abnormal and inconclusive findings on diagnostic imaging of breast: Secondary | ICD-10-CM

## 2021-02-25 ENCOUNTER — Other Ambulatory Visit: Payer: Self-pay

## 2021-02-25 DIAGNOSIS — R140 Abdominal distension (gaseous): Secondary | ICD-10-CM | POA: Insufficient documentation

## 2021-02-25 DIAGNOSIS — E785 Hyperlipidemia, unspecified: Secondary | ICD-10-CM | POA: Insufficient documentation

## 2021-02-25 DIAGNOSIS — I8393 Asymptomatic varicose veins of bilateral lower extremities: Secondary | ICD-10-CM

## 2021-02-25 DIAGNOSIS — M5090 Cervical disc disorder, unspecified, unspecified cervical region: Secondary | ICD-10-CM | POA: Insufficient documentation

## 2021-02-25 DIAGNOSIS — Z6831 Body mass index (BMI) 31.0-31.9, adult: Secondary | ICD-10-CM | POA: Insufficient documentation

## 2021-02-25 DIAGNOSIS — E039 Hypothyroidism, unspecified: Secondary | ICD-10-CM | POA: Insufficient documentation

## 2021-03-04 ENCOUNTER — Ambulatory Visit (HOSPITAL_COMMUNITY)
Admission: RE | Admit: 2021-03-04 | Discharge: 2021-03-04 | Disposition: A | Discharge status: Home / Self Care | Payer: 59 | Source: Ambulatory Visit | Attending: Vascular Surgery | Admitting: Vascular Surgery

## 2021-03-04 ENCOUNTER — Ambulatory Visit (INDEPENDENT_AMBULATORY_CARE_PROVIDER_SITE_OTHER): Payer: 59 | Admitting: Physician Assistant

## 2021-03-04 ENCOUNTER — Encounter: Payer: Self-pay | Admitting: Physician Assistant

## 2021-03-04 ENCOUNTER — Other Ambulatory Visit: Payer: Self-pay

## 2021-03-04 VITALS — BP 128/76 | HR 77 | Temp 98.1°F | Resp 20 | Ht 67.0 in | Wt 209.0 lb

## 2021-03-04 DIAGNOSIS — I8393 Asymptomatic varicose veins of bilateral lower extremities: Secondary | ICD-10-CM | POA: Insufficient documentation

## 2021-03-04 DIAGNOSIS — M25561 Pain in right knee: Secondary | ICD-10-CM | POA: Diagnosis not present
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
## 2021-08-05 ENCOUNTER — Other Ambulatory Visit: Payer: Self-pay | Admitting: Family Medicine

## 2021-08-05 DIAGNOSIS — Z1231 Encounter for screening mammogram for malignant neoplasm of breast: Secondary | ICD-10-CM

## 2021-08-22 ENCOUNTER — Ambulatory Visit
Admission: RE | Admit: 2021-08-22 | Discharge: 2021-08-22 | Disposition: A | Discharge status: Home / Self Care | Payer: 59 | Source: Ambulatory Visit | Attending: Family Medicine | Admitting: Family Medicine

## 2021-08-22 ENCOUNTER — Other Ambulatory Visit: Payer: Self-pay

## 2021-08-22 DIAGNOSIS — Z1231 Encounter for screening mammogram for malignant neoplasm of breast: Secondary | ICD-10-CM

## 2022-02-01 ENCOUNTER — Encounter: Payer: Self-pay | Admitting: Physician Assistant

## 2022-02-01 ENCOUNTER — Ambulatory Visit: Payer: 59 | Admitting: Physician Assistant

## 2022-02-01 ENCOUNTER — Other Ambulatory Visit: Payer: Self-pay

## 2022-02-01 DIAGNOSIS — Z1283 Encounter for screening for malignant neoplasm of skin: Secondary | ICD-10-CM | POA: Diagnosis not present

## 2022-02-01 DIAGNOSIS — L719 Rosacea, unspecified: Secondary | ICD-10-CM

## 2022-02-01 DIAGNOSIS — D225 Melanocytic nevi of trunk: Secondary | ICD-10-CM

## 2022-02-01 DIAGNOSIS — D485 Neoplasm of uncertain behavior of skin: Secondary | ICD-10-CM

## 2022-02-01 MED ORDER — METRONIDAZOLE 0.75 % EX GEL: 1.0000 "application " | Freq: Two times a day (BID) | CUTANEOUS | 6 refills | Status: AC

## 2022-02-06 ENCOUNTER — Other Ambulatory Visit: Payer: Self-pay | Admitting: Family Medicine

## 2022-02-06 ENCOUNTER — Ambulatory Visit
Admission: RE | Admit: 2022-02-06 | Discharge: 2022-02-06 | Disposition: A | Discharge status: Home / Self Care | Payer: 59 | Source: Ambulatory Visit | Attending: Family Medicine | Admitting: Family Medicine

## 2022-02-06 DIAGNOSIS — M79605 Pain in left leg: Secondary | ICD-10-CM

## 2022-02-08 ENCOUNTER — Telehealth: Payer: Self-pay | Admitting: *Deleted

## 2022-02-08 NOTE — Telephone Encounter (Signed)
-----   Message from Warren Danes, [place] sent at 02/07/2022  4:34 PM EST ----- RTC if pigment appears. Appt. In 6-9 months she can cancel if no pigment. Yearly.

## 2022-02-08 NOTE — Telephone Encounter (Signed)
Called patient to give pathology results- no answer and mailbox full- unable to leave message.

## 2022-02-26 ENCOUNTER — Encounter: Payer: Self-pay | Admitting: Physician Assistant

## 2022-02-26 NOTE — Progress Notes (Signed)
° °  Follow-Up Visit   Subjective  Leah Perry is a 61 y.o. female who presents for the following: Annual Exam (Personal history of mild and moderate atypia. Right hand possible psoriasis x 2 months tx cutivate per patient (husbands cream?)). Dark moles wants checked. Rosaceal is flaring and needs some mecication.    The following portions of the chart were reviewed this encounter and updated as appropriate:  Tobacco   Allergies   Meds   Problems   Med Hx   Surg Hx   Fam Hx       Objective  Well appearing patient in no apparent distress; mood and affect are within normal limits.  A full examination was performed including scalp, head, eyes, ears, nose, lips, neck, chest, axillae, abdomen, back, buttocks, bilateral upper extremities, bilateral lower extremities, hands, feet, fingers, toes, fingernails, and toenails. All findings within normal limits unless otherwise noted below.  Head to Toe  No signs of  non mole skin cancer noted at the time of the visit.   Mid Abdomen Bichromic dark nested macule.        Right Abdomen (side) - Upper Bichromic dark nested macule.        Left Malar Cheek, Right Malar Cheek Centrifacial erythema with papules.   Assessment & Plan  Encounter for screening for malignant neoplasm of skin Head to Toe  Yearly skin check  Neoplasm of uncertain behavior of skin (2) Mid Abdomen  Skin / nail biopsy Type of biopsy: tangential   Informed consent: discussed and consent obtained   Timeout: patient name, date of birth, surgical site, and procedure verified   Procedure prep:  Patient was prepped and draped in usual sterile fashion (Non sterile) Prep type:  Chlorhexidine Anesthesia: the lesion was anesthetized in a standard fashion   Anesthetic:  1% lidocaine w/ epinephrine 1-100,000 local infiltration Instrument used: flexible razor blade   Outcome: patient tolerated procedure well   Post-procedure details: wound care instructions given     Specimen 1 - Surgical pathology Differential Diagnosis: r/o atypia  Check Margins: yes  Right Abdomen (side) - Upper  Skin / nail biopsy Type of biopsy: tangential   Informed consent: discussed and consent obtained   Timeout: patient name, date of birth, surgical site, and procedure verified   Procedure prep:  Patient was prepped and draped in usual sterile fashion (Non sterile) Prep type:  Chlorhexidine Anesthesia: the lesion was anesthetized in a standard fashion   Anesthetic:  1% lidocaine w/ epinephrine 1-100,000 local infiltration Instrument used: flexible razor blade   Outcome: patient tolerated procedure well   Post-procedure details: wound care instructions given    Specimen 2 - Surgical pathology Differential Diagnosis: r/o atypia  Check Margins: yes  Rosacea Left Malar Cheek; Right Malar Cheek  metroNIDAZOLE (METROGEL) 0.75 % gel - Left Malar Cheek, Right Malar Cheek Apply 1 application topically 2 (two) times daily.    I, Ahmaad Neidhardt, PA-C, have reviewed all documentation's for this visit.  The documentation on 02/26/22 for the exam, diagnosis, procedures and orders are all accurate and complete.

## 2022-06-14 ENCOUNTER — Ambulatory Visit: Payer: 59 | Admitting: Physician Assistant

## 2022-06-14 ENCOUNTER — Encounter: Payer: Self-pay | Admitting: Physician Assistant

## 2022-06-14 DIAGNOSIS — L2084 Intrinsic (allergic) eczema: Secondary | ICD-10-CM

## 2022-06-14 MED ORDER — OPZELURA 1.5 % EX CREA: 1.0000 | TOPICAL_CREAM | Freq: Every day | CUTANEOUS | 0 refills | Status: DC

## 2022-06-14 MED ORDER — FLUOCINONIDE EMULSIFIED BASE 0.05 % EX CREA: 1.0000 | TOPICAL_CREAM | Freq: Three times a day (TID) | CUTANEOUS | 2 refills | Status: DC

## 2022-06-14 NOTE — Progress Notes (Signed)
   Follow-Up Visit   Subjective  Leah Perry is a 61 y.o. female who presents for the following: Eczema (Hand eczema x months. It started on the dorsum of her ring finger. It was managed by aquaphor ointment. She had discontinued use of hand sanitizer. She works in [employer] on computer from home.). It is flaring all over her hands now. She did have the gel fingernails put on where they dipped them in a solution to remove the nails.  She is unsure of any additional encounter that she could think of that would make her hands flare.    The following portions of the chart were reviewed this encounter and updated as appropriate:  Tobacco  Allergies  Meds  Problems  Med Hx  Surg Hx  Fam Hx      Objective  Well appearing patient in no apparent distress; mood and affect are within normal limits.  A focused examination was performed including hands . Relevant physical exam findings are noted in the Assessment and Plan.  Left Hand - Anterior, Right Hand - Anterior Thin scale with fingertips exhibiting redness.    Assessment & Plan  Intrinsic atopic dermatitis Left Hand - Anterior; Right Hand - Anterior  Patient will use Fluocinonide x 2 weeks and then start using the sample of Opzelura.   Ruxolitinib Phosphate (OPZELURA) 1.5 % CREA - Left Hand - Anterior, Right Hand - Anterior Apply 1 Application topically daily.  fluocinonide-emollient (LIDEX-E) 0.05 % cream - Left Hand - Anterior, Right Hand - Anterior Apply 1 Application topically 3 (three) times daily.    I, Leah Wissner, PA-C, have reviewed all documentation's for this visit.  The documentation on 06/14/22 for the exam, diagnosis, procedures and orders are all accurate and complete.

## 2022-10-30 IMAGING — MG MM DIGITAL SCREENING BILAT W/ TOMO AND CAD
8 series · 8 of 24 positions shown · non-contrast
Comparison: Previous exam(s).

CLINICAL DATA: Screening.

EXAM:
DIGITAL SCREENING BILATERAL MAMMOGRAM WITH TOMOSYNTHESIS AND CAD
TECHNIQUE: Bilateral screening digital craniocaudal and mediolateral oblique
mammograms were obtained. Bilateral screening digital breast
tomosynthesis was performed. The images were evaluated with
computer-aided detection.

[R CC synth-2D]
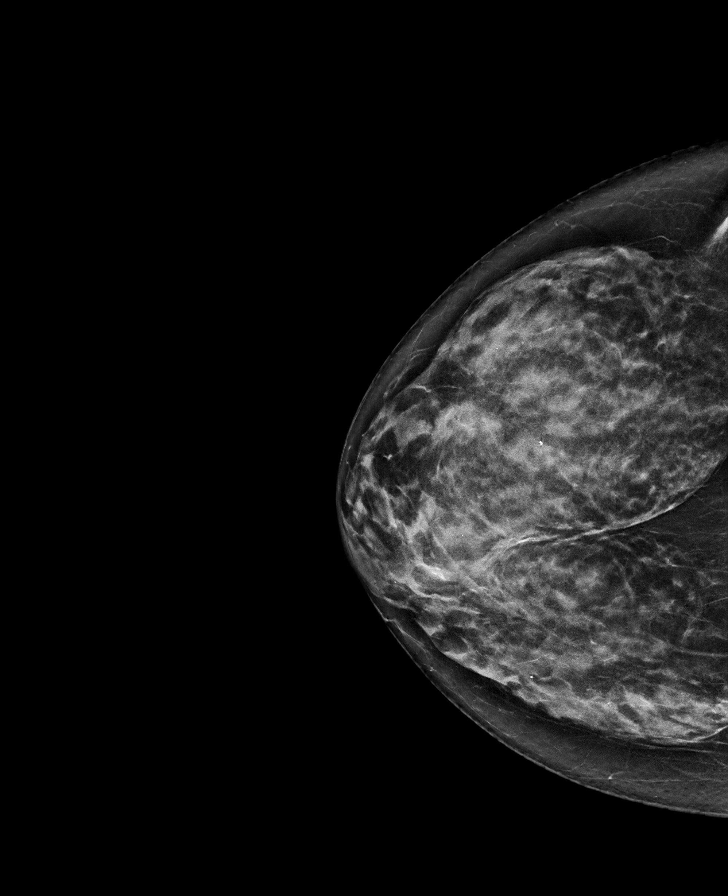

[R MLO synth-2D]
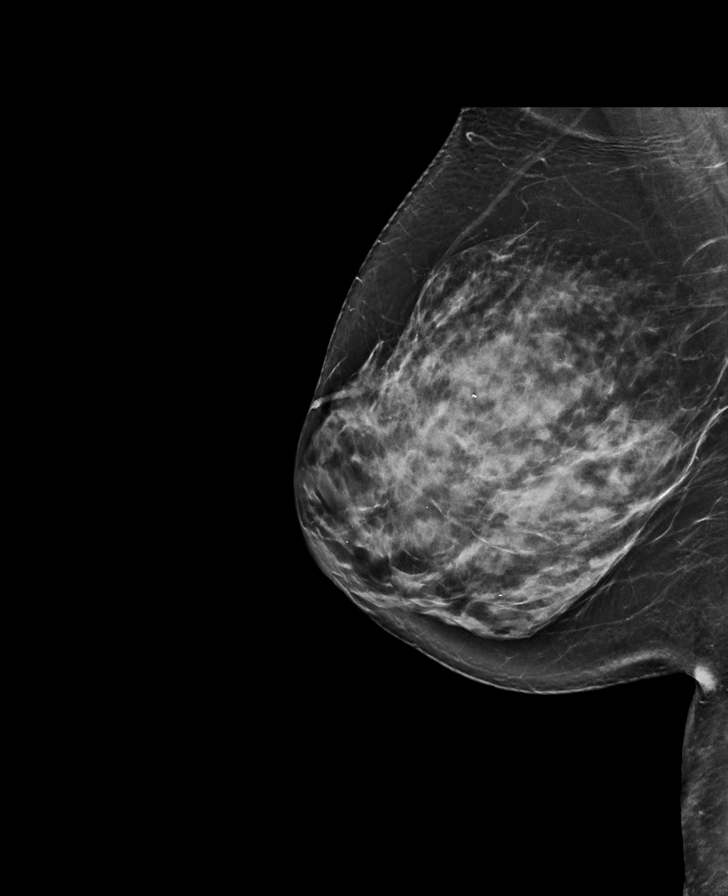

[L CC synth-2D]
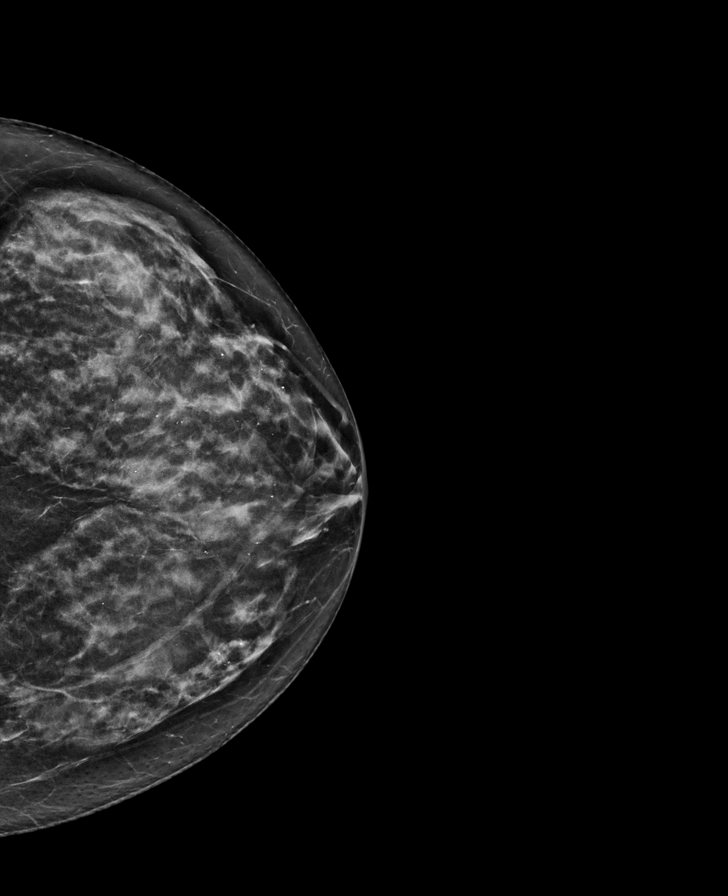

[L MLO synth-2D]
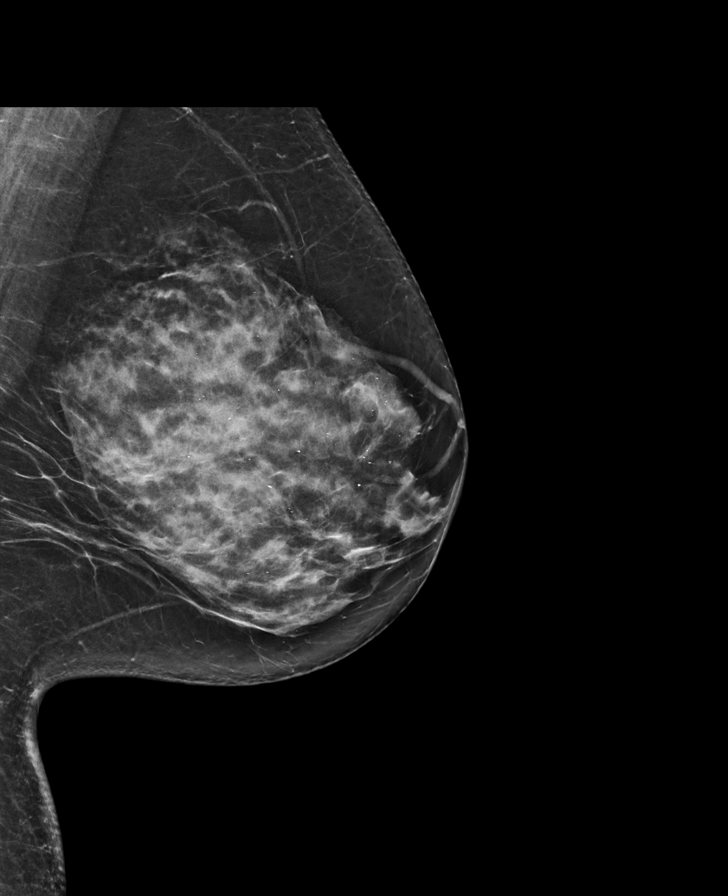

[R MLO tomo · tomo slice 40/79.0]
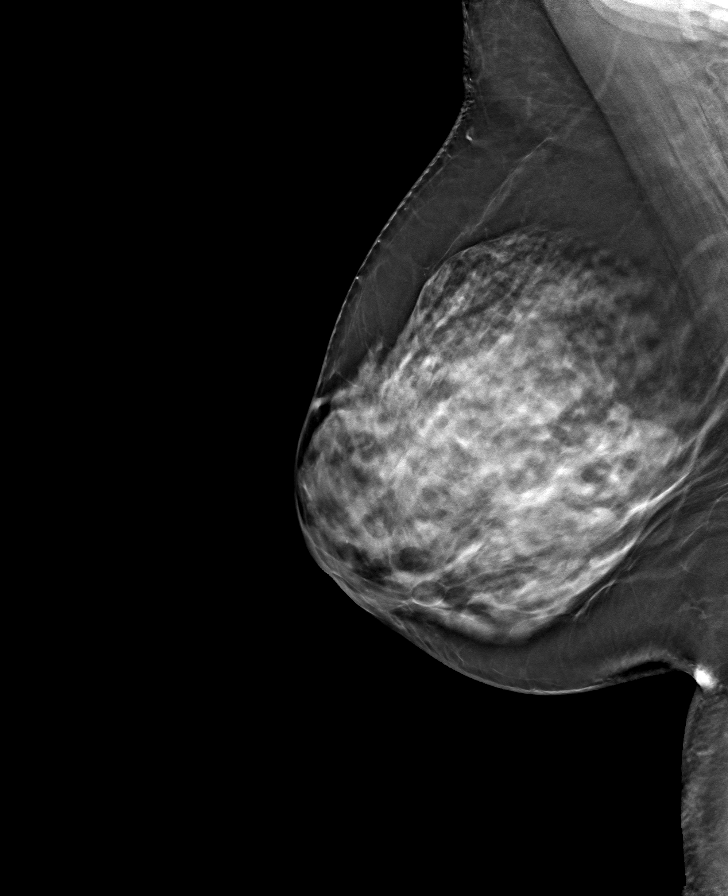

[L CC tomo · tomo slice 31/62.0]
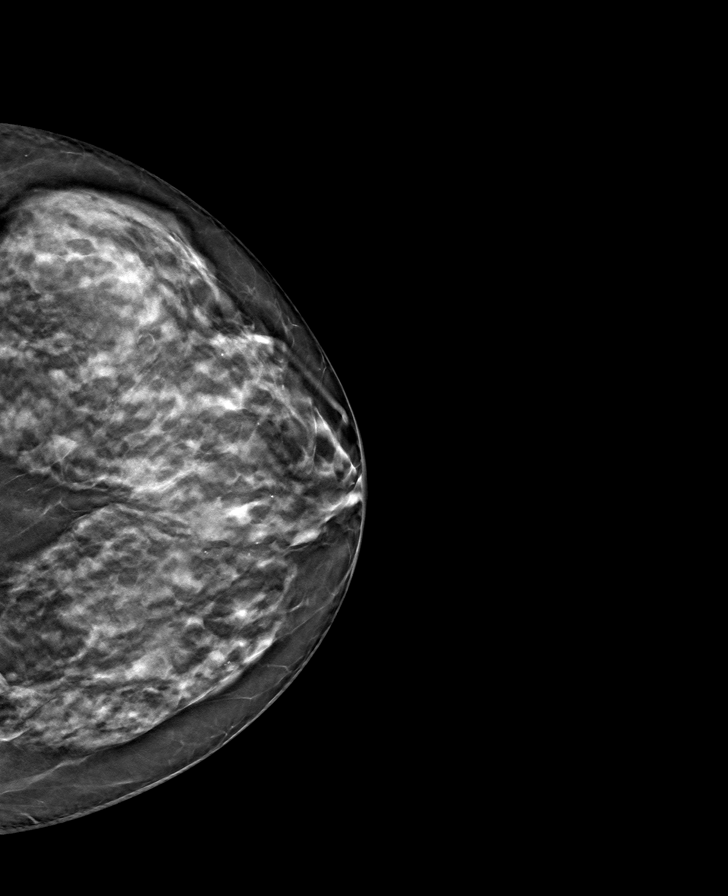

[R CC tomo · tomo slice 35/69.0]
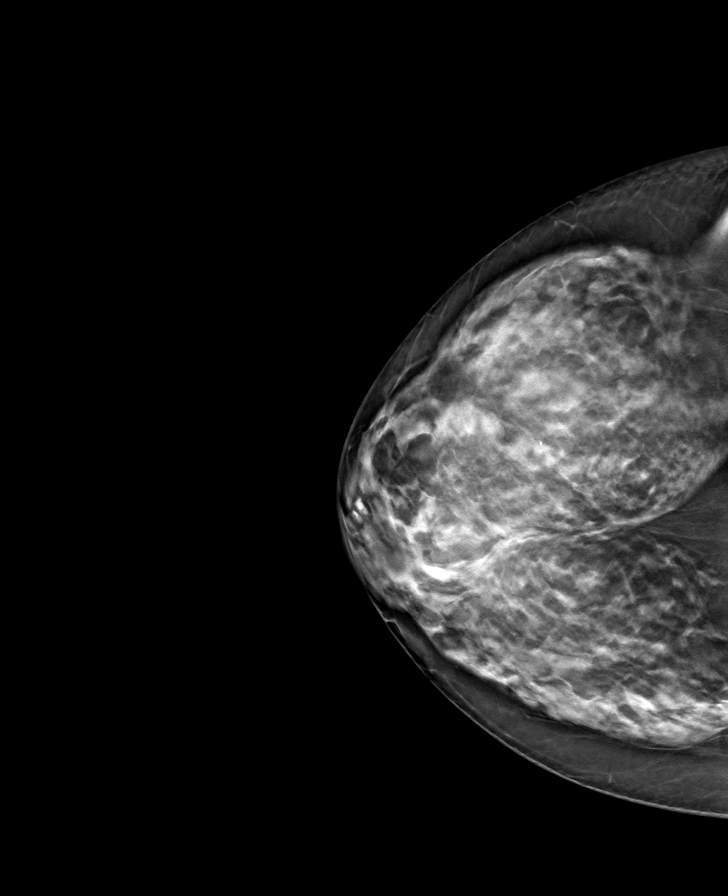

[L MLO tomo · tomo slice 39/77.0]
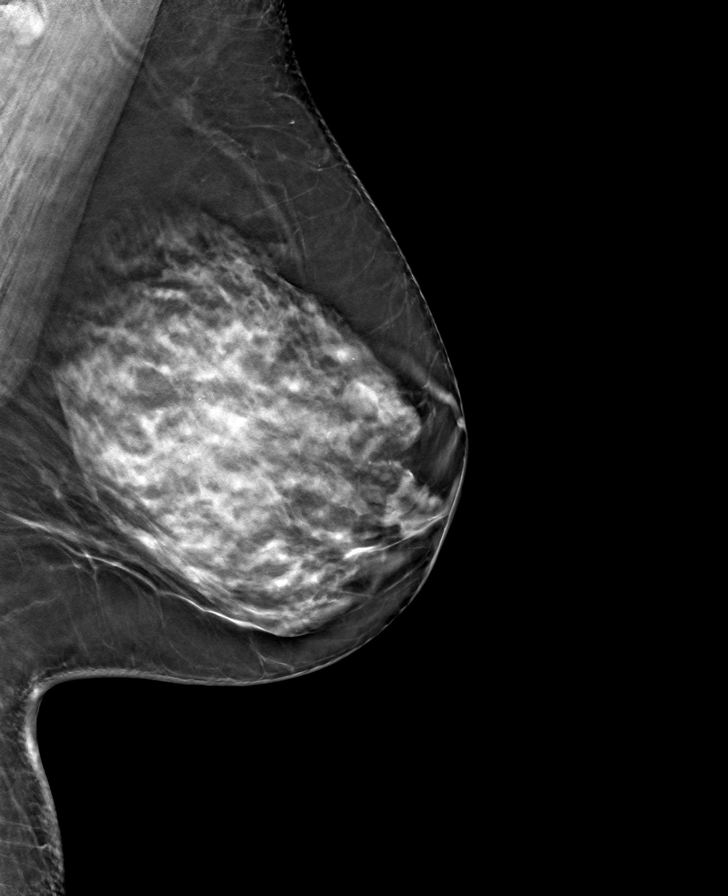

[8 of 24 positions shown; findings below may reference images not displayed]

ACR Breast Density Category d: The breast tissue is extremely dense,
which lowers the sensitivity of mammography
FINDINGS: There are no findings suspicious for malignancy.
IMPRESSION: No mammographic evidence of malignancy. A result letter of this
screening mammogram will be mailed directly to the patient.

RECOMMENDATION:
Screening mammogram in one year. (Code:TA-V-WV9)

BI-RADS CATEGORY  1: Negative.

## 2022-11-01 ENCOUNTER — Encounter: Payer: Self-pay | Admitting: Gastroenterology

## 2023-02-01 ENCOUNTER — Ambulatory Visit: Payer: 59 | Admitting: Physician Assistant

## 2023-04-16 IMAGING — CR DG TIBIA/FIBULA 2V*L*
2 series · 2 of 2 positions shown · non-contrast
Comparison: None.

CLINICAL DATA: left leg pain

EXAM:
LEFT TIBIA AND FIBULA - 2 VIEW

[x tib-fib ap left (1 of 2)]
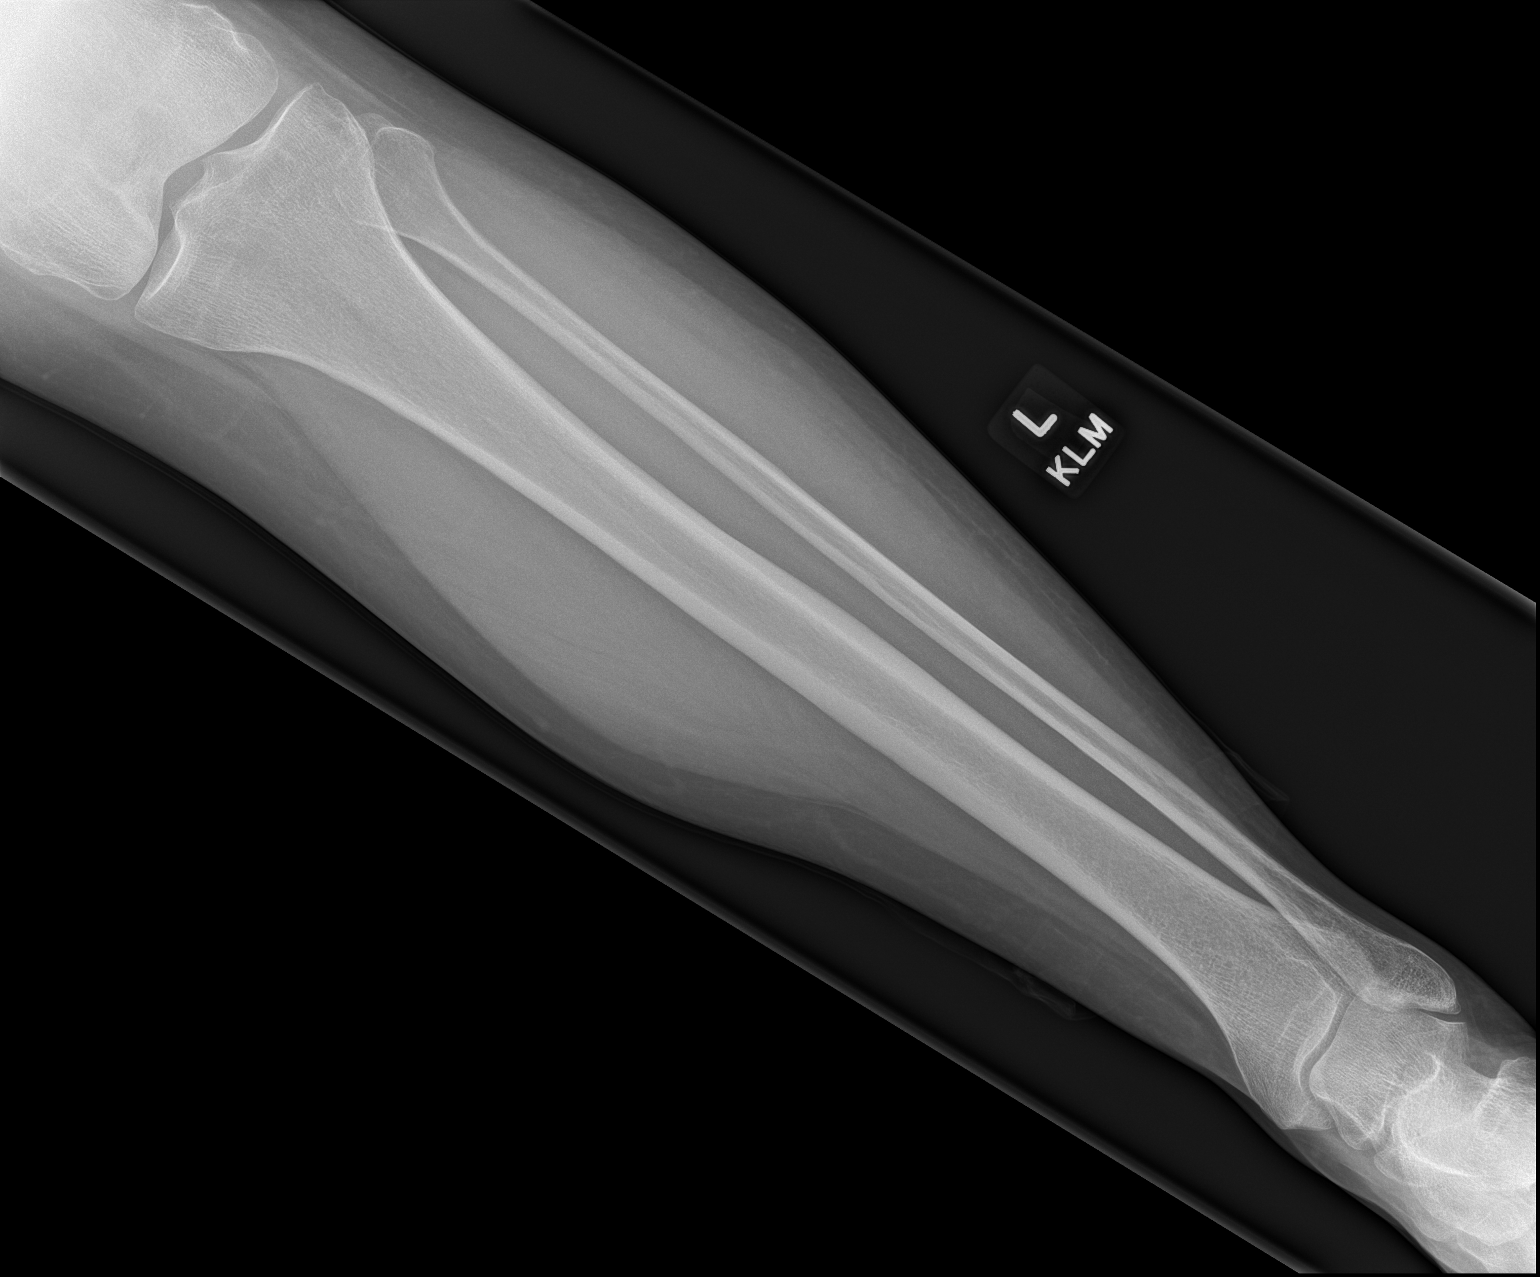

[x tib-fib ap left (2 of 2)]
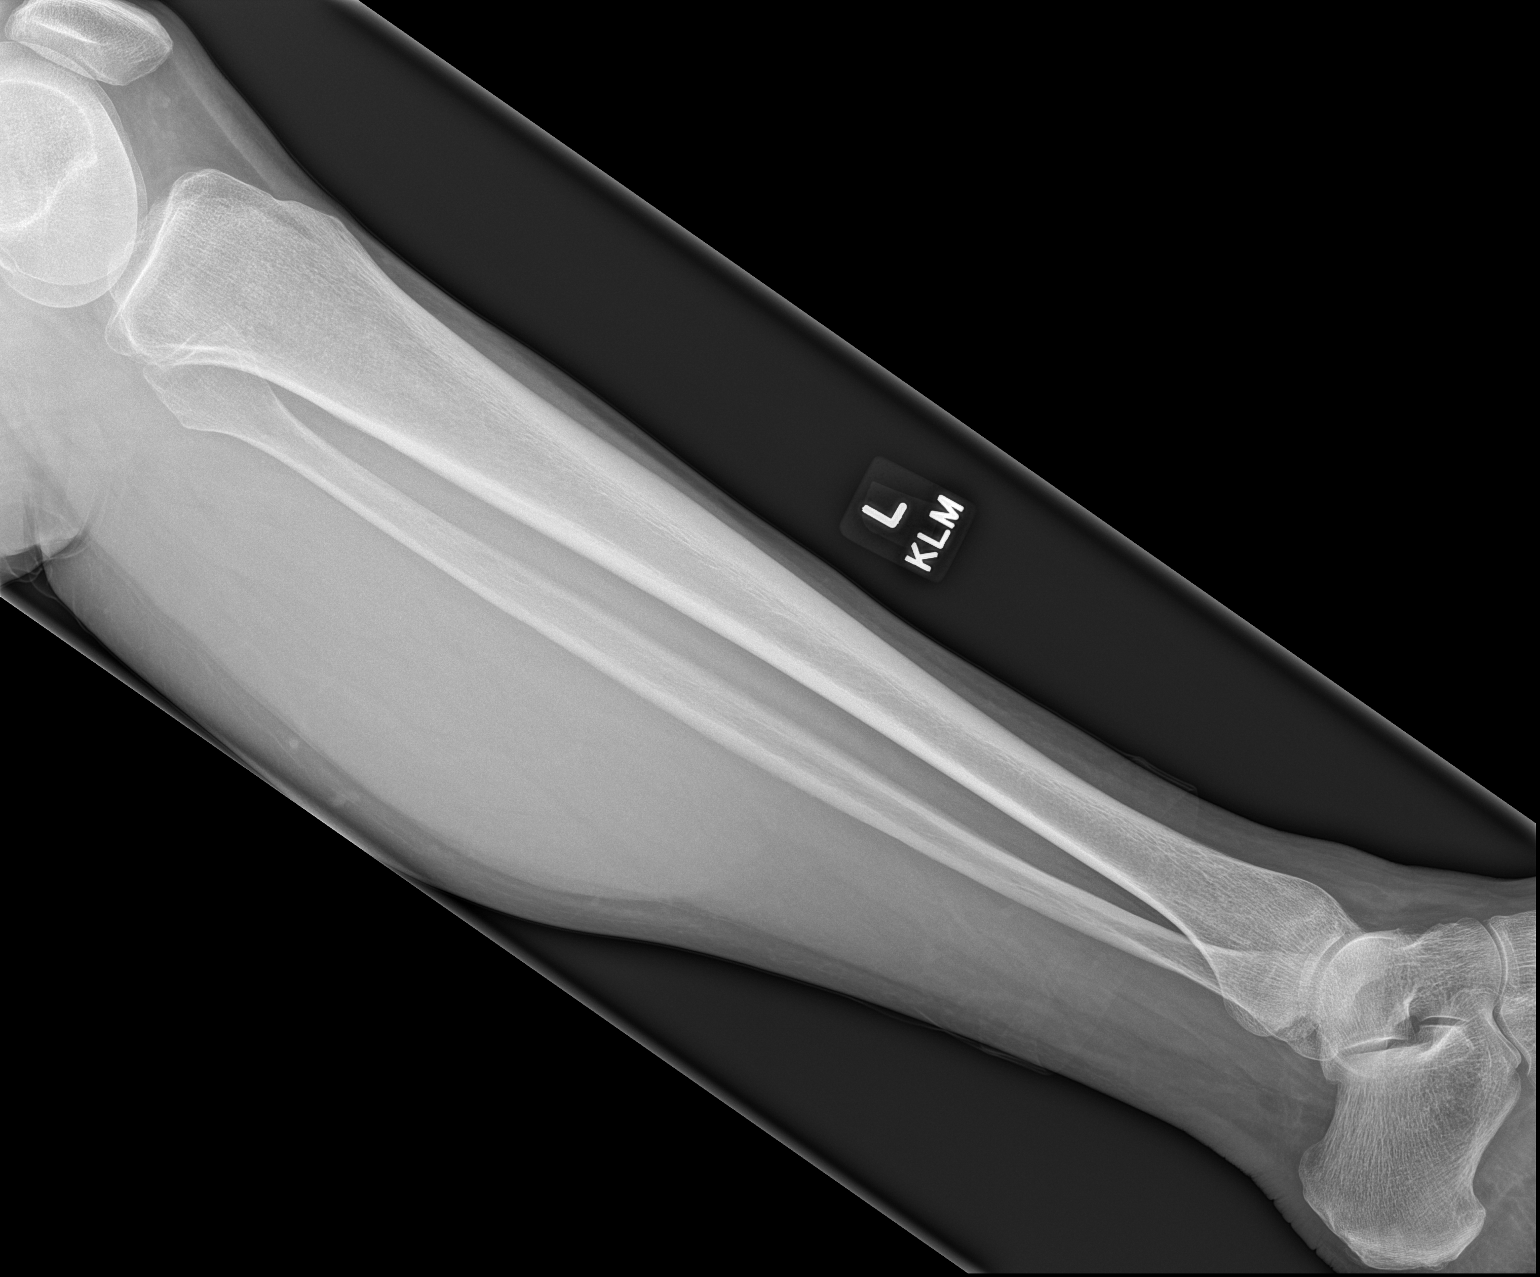

[2 of 2 positions shown; findings below may reference images not displayed]

FINDINGS: There is no evidence of fracture or other focal bone lesions. Soft
tissues are unremarkable.
IMPRESSION: Negative.

## 2024-02-13 ENCOUNTER — Encounter: Payer: Self-pay | Admitting: Gastroenterology

## 2024-02-13 ENCOUNTER — Ambulatory Visit: Payer: 59 | Admitting: Gastroenterology

## 2024-02-13 VITALS — BP 118/68 | HR 70 | Ht 67.0 in | Wt 199.0 lb

## 2024-02-13 DIAGNOSIS — R6881 Early satiety: Secondary | ICD-10-CM | POA: Diagnosis not present

## 2024-02-13 DIAGNOSIS — K5900 Constipation, unspecified: Secondary | ICD-10-CM | POA: Diagnosis not present

## 2024-02-13 DIAGNOSIS — R142 Eructation: Secondary | ICD-10-CM | POA: Diagnosis not present

## 2024-02-13 DIAGNOSIS — Z860100 Personal history of colon polyps, unspecified: Secondary | ICD-10-CM

## 2024-02-13 DIAGNOSIS — R110 Nausea: Secondary | ICD-10-CM | POA: Diagnosis not present

## 2024-02-13 MED ORDER — FAMOTIDINE 40 MG PO TABS: 40.0000 mg | ORAL_TABLET | Freq: Every day | ORAL | 3 refills | Status: DC

## 2024-02-13 NOTE — Progress Notes (Signed)
Chief Complaint: recall colon Primary GI MD: Dr. Paulo Fruit  HPI: Discussed the use of AI scribe software for clinical note transcription with the patient, who gave verbal consent to proceed.  History of Present Illness   Leah Perry is a 63 year old female who presents with gastrointestinal symptoms for evaluation and a scheduled colonoscopy.  She experiences gastrointestinal symptoms that she suspects may be related to her use of Wegovy. She describes 'little burpees' that feel like a gasp and a hiccup simultaneously and is unsure if these are related to [facility] or another issue such as reflux. They began around the same time she started the medication. Occasional heartburn is present, but there is no trouble swallowing. Nausea is attributed to [facility], and she feels full quickly after eating. She wakes up between 1 and 3 AM with severe lower abdominal pain and discomfort. Constipation has developed since starting Wegovy, whereas previously she experienced the opposite issue. Approximately 30 pounds of weight loss has occurred over the past five to six months, attributed to the medication.  Current medications include pantoprazole once daily in the morning and Pepcid once daily at night, but these have not been effective in alleviating her symptoms. She eats dinner late, around 7 or 8 PM, and often lies down shortly after, which may contribute to her symptoms.  [place] [employer], which involves significant travel.  Family history is notable for her father having a condition requiring esophageal dilation. No dysphagia today    PREVIOUS GI WORKUP   Colonoscopy 11/2018 - Four 1 to 2 mm polyps in rectosigmoid colon.  Resected and retrieved. - Diverticulosis in sigmoid colon - Nonbleeding internal hemorrhoids - Otherwise normal -- unable to find path report - Repeat 5 years                                                               colonoscopy on 11/05/2012, done at [facility]. Report is available for review today. She had an excellent bowel preparation. There was multiple 1-2 mm polyps in the rectosigmoid colon which were removed with cold forceps and consistent with hyperplastic polyps. Moderate internal hemorrhoids noted with small diverticula in the sigmoid colon. Exam was otherwise normal. No adenomas. She was told to consider repeat colonoscopy in 5 years   Past Medical History:  Diagnosis Date   Atypical mole 02/14/2019   upper paraspinal mild   Atypical mole 2020   right scapula mild   Atypical mole 2020   left lower outer shin mils   Atypical mole 2020   right pinky toe inner moderate   Atypical mole 2020   right deltoid melanocytic prolif. tx widershave   Cervical disc disease    GERD (gastroesophageal reflux disease)    Hyperlipidemia    Hypothyroidism     Past Surgical History:  Procedure Laterality Date   BREAST BIOPSY Right 12/2018   x2   BREAST LUMPECTOMY WITH RADIOACTIVE SEED LOCALIZATION Right 01/01/2019   Procedure: RIGHT BREAST RADIOACTIVE SEED LOCALIZATION LUMPECTOMY (2 seeds--2 sites);  Surgeon: Harriette Bouillon, MD;  Location: [facility];  Service: General;  Laterality: Right;   GALLBLADDER SURGERY  2017    Current Outpatient Medications  Medication Sig Dispense Refill  famotidine (PEPCID) 40 MG tablet Take 1 tablet (40 mg total) by mouth daily. 30 tablet 3   levothyroxine (SYNTHROID) 125 MCG tablet Take 125 mcg by mouth daily before breakfast.     ondansetron (ZOFRAN) 4 MG tablet Take 4 mg by mouth daily as needed.     pantoprazole (PROTONIX) 40 MG tablet Take 40 mg by mouth daily.     rosuvastatin (CRESTOR) 10 MG tablet Take 10 mg by mouth at bedtime.     WEGOVY 0.5 MG/0.5ML SOAJ Inject 0.5 mg into the skin once a week.     No current facility-administered medications for this visit.    Allergies as of 02/13/2024   (No Known Allergies)    Family History  Problem  Relation Age of Onset   Uterine cancer Mother    Cancer - Lung Father    Skin cancer Father    Skin cancer Maternal Grandfather    Lung cancer Paternal Grandmother     Social History   Socioeconomic History   Marital status: Married    Spouse name: Not on file   Number of children: Not on file   Years of education: Not on file   Highest education level: Not on file  Occupational History   Not on file  Tobacco Use   Smoking status: Former    Current packs/day: 0.00    Types: Cigarettes    Quit date: 2010    Years since quitting: 15.1   Smokeless tobacco: Never  Vaping Use   Vaping status: Never Used  Substance and Sexual Activity   Alcohol use: Yes    Comment: occassionally   Drug use: Never   Sexual activity: Not on file  Other Topics Concern   Not on file  Social History Narrative   Not on file   Social Drivers of Health   Financial Resource Strain: Not on file  Food Insecurity: Not on file  Transportation Needs: Not on file  Physical Activity: Not on file  Stress: Not on file  Social Connections: Unknown (02/21/2023)   Received from [facility]   Social Network    Social Network: Not on file  Intimate Partner Violence: Unknown (02/21/2023)   Received from [facility]   HITS    Physically Hurt: Not on file    Insult or Talk Down To: Not on file    Threaten Physical Harm: Not on file    Scream or Curse: Not on file    Review of Systems:    Constitutional: No weight loss, fever, chills, weakness or fatigue HEENT: Eyes: No change in vision               Ears, Nose, Throat:  No change in hearing or congestion Skin: No rash or itching Cardiovascular: No chest pain, chest pressure or palpitations   Respiratory: No SOB or cough Gastrointestinal: See HPI and otherwise negative Genitourinary: No dysuria or change in urinary frequency Neurological: No headache, dizziness or syncope Musculoskeletal: No new muscle or joint pain Hematologic: No bleeding or  bruising Psychiatric: No history of depression or anxiety    Physical Exam:  Vital signs: BP 118/68   Pulse 70   Ht 5\' 7"  (1.702 m)   Wt 199 lb (90.3 kg)   LMP  (LMP Unknown) Comment: last period 2012  BMI 31.17 kg/m   Constitutional: NAD, Well developed, Well nourished, alert and cooperative Head:  Normocephalic and atraumatic. Eyes:   PEERL, EOMI. No icterus. Conjunctiva pink. Respiratory: Respirations even  and unlabored. Lungs clear to auscultation bilaterally.   No wheezes, crackles, or rhonchi.  Cardiovascular:  Regular rate and rhythm. No peripheral edema, cyanosis or pallor.  Gastrointestinal:  Soft, nondistended, nontender. No rebound or guarding. Normal bowel sounds. No appreciable masses or hepatomegaly. Rectal:  Not performed.  Msk:  Symmetrical without gross deformities. Without edema, no deformity or joint abnormality.  Neurologic:  Alert and  oriented x4;  grossly normal neurologically.  Skin:   Dry and intact without significant lesions or rashes. Psychiatric: Oriented to person, place and time. Demonstrates good judgement and reason without abnormal affect or behaviors.  RELEVANT LABS AND IMAGING: CBC No results found for: "WBC", "RBC", "HGB", "HCT", "PLT", "MCV", "MCH", "MCHC", "RDW", "LYMPHSABS", "MONOABS", "EOSABS", "BASOSABS"  CMP  No results found for: "NA", "K", "CL", "CO2", "GLUCOSE", "BUN", "CREATININE", "CALCIUM", "PROT", "ALBUMIN", "AST", "ALT", "ALKPHOS", "BILITOT", "GFRNONAA", "GFRAA"   Assessment/Plan:      Constipation Nausea Eructation Nausea, early satiety, burping, and constipation since starting Wegovy. No dysphagia, weight loss, or severe GERD symptoms. Likely due to delayed gastric emptying caused by [facility] as well as slow transit constipation. -- Start Miralax 1 capful daily for constipation. -- Increase Pepcid to 40mg  daily for GERD symptoms. - Educated patient on lifestyle modifications including eating small meals throughout the day,  not eating 3 to 4 hours before bedtime, and trial of GERD bed wedge to help with nighttime symptoms. - Offered endoscopy although with minor symptoms likely not necessary.  Patient politely declined.  If worsening symptoms we can reconsider  History of colon polyps Last colonoscopy in 2019 with history of polyps and repeat of 5 years -Schedule colonoscopy. - 2-day prep - I thoroughly discussed the procedure with the patient (at bedside) to include nature of the procedure, alternatives, benefits, and risks (including but not limited to bleeding, infection, perforation, anesthesia/cardiac pulmonary complications).  Patient verbalized understanding and gave verbal consent to proceed with procedure.   General Health Maintenance -Consider lifestyle modifications such as not eating soon before going to bed, staying upright after meals, and using a bed wedge to help with GERD symptoms.       Boone Master, PA-C [facility] 02/13/2024, 9:53 AM  Cc: Farris Has, MD

## 2024-02-13 NOTE — Progress Notes (Signed)
 Agree with assessment and plan as outlined.

## 2024-02-13 NOTE — Patient Instructions (Addendum)
Start taking Miralax 1 capful (17 grams) 1x / day for 1 week.   If this is not effective, increase to 1 dose 2x / day for 1 week.   If this is still not effective, increase to two capfuls (34 grams) 2x / day.   Can adjust dose as needed based on response. Can take 1/2 cap daily, skip days, or increase per day.    You have been scheduled for a colonoscopy. Please follow written instructions given to you at your visit today.   If you use inhalers (even only as needed), please bring them with you on the day of your procedure.  DO NOT TAKE 7 DAYS PRIOR TO TEST- Trulicity (dulaglutide) Ozempic, Wegovy (semaglutide) Mounjaro (tirzepatide) Bydureon Bcise (exanatide extended release)  DO NOT TAKE 1 DAY PRIOR TO YOUR TEST Rybelsus (semaglutide) Adlyxin (lixisenatide) Victoza (liraglutide) Byetta (exanatide) ___________________________________________________________________________  Leah Perry will receive your bowel preparation through [facility], which ensures the lowest copay and home delivery, with outreach via text or call from an [phone] number. Please respond promptly to avoid rescheduling of your procedure. If you are interested in alternative options or have any questions regarding your prep, please contact them at [phone] ____________________________________________________________________________   To avoid long hold times on the phone, you may also utilize the secure chat feature on the [facility] website to request that they call you back for transaction completion or to expedite your concerns.   Due to recent changes in healthcare laws, you may see the results of your imaging and laboratory studies on MyChart before your provider has had a chance to review them.  We understand that in some cases there may be results that are confusing or concerning to you. Not all laboratory results come back in the same time frame and the provider may be waiting for multiple results in order to interpret  others.  Please give [place] 48 hours in order for your provider to thoroughly review all the results before contacting the office for clarification of your results.

## 2024-03-24 ENCOUNTER — Encounter: Payer: Self-pay | Admitting: Gastroenterology

## 2024-04-01 ENCOUNTER — Encounter: Payer: 59 | Admitting: Gastroenterology

## 2024-04-03 ENCOUNTER — Other Ambulatory Visit: Payer: Self-pay | Admitting: Gastroenterology

## 2024-04-03 DIAGNOSIS — R142 Eructation: Secondary | ICD-10-CM

## 2024-05-28 ENCOUNTER — Other Ambulatory Visit: Payer: Self-pay | Admitting: Gastroenterology

## 2024-05-28 DIAGNOSIS — R142 Eructation: Secondary | ICD-10-CM

## 2024-06-04 ENCOUNTER — Encounter: Payer: Self-pay | Admitting: *Deleted

## 2024-06-04 ENCOUNTER — Telehealth: Payer: Self-pay | Admitting: Gastroenterology

## 2024-06-04 NOTE — Telephone Encounter (Signed)
 Patient is now activated for My Chart and has received her instructions confirmed with patient

## 2024-06-04 NOTE — Telephone Encounter (Signed)
 PT rescheduled her procedure from an 9am appoitment to an 830am and she wants to have new instructions emailed to her. Requesting to speak with a nurse

## 2024-06-04 NOTE — Telephone Encounter (Signed)
 Completed new instructions , Told patient if she had My Chart she could see them. She wanted to activate it but the code on her after visit summery has expired. I had Trevor Fudge send her a text with an Activation code. I will give the patient a few minutes to activate it to make sure she had no issues, then she will be able to see her new instructions online.

## 2024-06-09 ENCOUNTER — Encounter: Payer: Self-pay | Admitting: Gastroenterology

## 2024-06-09 ENCOUNTER — Ambulatory Visit (AMBULATORY_SURGERY_CENTER): Admitting: Gastroenterology

## 2024-06-09 VITALS — BP 126/84 | HR 64 | Temp 97.3°F | Resp 11 | Ht 67.0 in | Wt 199.0 lb

## 2024-06-09 DIAGNOSIS — K648 Other hemorrhoids: Secondary | ICD-10-CM

## 2024-06-09 DIAGNOSIS — D123 Benign neoplasm of transverse colon: Secondary | ICD-10-CM | POA: Diagnosis not present

## 2024-06-09 DIAGNOSIS — Z1211 Encounter for screening for malignant neoplasm of colon: Secondary | ICD-10-CM

## 2024-06-09 DIAGNOSIS — K635 Polyp of colon: Secondary | ICD-10-CM

## 2024-06-09 DIAGNOSIS — D125 Benign neoplasm of sigmoid colon: Secondary | ICD-10-CM

## 2024-06-09 DIAGNOSIS — K5730 Diverticulosis of large intestine without perforation or abscess without bleeding: Secondary | ICD-10-CM | POA: Diagnosis not present

## 2024-06-09 MED ORDER — SODIUM CHLORIDE 0.9 % IV SOLN: 500.0000 mL | Freq: Once | INTRAVENOUS | Status: DC

## 2024-06-09 NOTE — Progress Notes (Signed)
 Called to room to assist during endoscopic procedure.  Patient ID and intended procedure confirmed with present staff. Received instructions for my participation in the procedure from the performing physician.

## 2024-06-09 NOTE — Progress Notes (Signed)
 [facility] History and Physical   Primary Care Physician:  Ronna Coho, MD   Reason for Procedure:   Colon cancer screening  Plan:    colonoscopy     HPI: Leah Perry is a 63 y.o. female  here for colonoscopy surveillance - last exam initially thought to be 11/2018 at another facility [facility] - actually her last exam was in 2013 with hyperplastic polyps (report was faxed in 2019 but done in 2013) History of chronic constipation. Did a double prep.   No family history of colon cancer known. Otherwise feels well without any cardiopulmonary symptoms.   I have discussed risks / benefits of anesthesia and endoscopic procedure with Clessie C Klebba and they wish to proceed with the exams as outlined today.    Past Medical History:  Diagnosis Date   Atypical mole 02/14/2019   upper paraspinal mild   Atypical mole 2020   right scapula mild   Atypical mole 2020   left lower outer shin mils   Atypical mole 2020   right pinky toe inner moderate   Atypical mole 2020   right deltoid melanocytic prolif. tx widershave   Cervical disc disease    GERD (gastroesophageal reflux disease)    Hyperlipidemia    Hypothyroidism     Past Surgical History:  Procedure Laterality Date   BREAST BIOPSY Right 12/2018   x2   BREAST LUMPECTOMY WITH RADIOACTIVE SEED LOCALIZATION Right 01/01/2019   Procedure: RIGHT BREAST RADIOACTIVE SEED LOCALIZATION LUMPECTOMY (2 seeds--2 sites);  Surgeon: Sim Dryer, MD;  Location: [facility];  Service: General;  Laterality: Right;   CHOLECYSTECTOMY     COLONOSCOPY     GALLBLADDER SURGERY  2017    Prior to Admission medications   Medication Sig Start Date End Date Taking? Authorizing Provider  famotidine  (PEPCID ) 40 MG tablet TAKE 1 TABLET BY MOUTH EVERY DAY 05/28/24  Yes McMichael, Bayley M, PA-C  levothyroxine (SYNTHROID) 125 MCG tablet Take 125 mcg by mouth daily before breakfast.   Yes [provider]  pantoprazole  (PROTONIX) 40 MG tablet Take 40 mg by mouth daily.   Yes [provider]  rosuvastatin (CRESTOR) 10 MG tablet Take 10 mg by mouth at bedtime. 03/03/21  Yes [provider]  ondansetron  (ZOFRAN ) 4 MG tablet Take 4 mg by mouth daily as needed. 12/31/23   [provider]  WEGOVY 0.5 MG/0.5ML SOAJ Inject 0.5 mg into the skin once a week. 09/15/23   [provider]    Current Outpatient Medications  Medication Sig Dispense Refill   famotidine  (PEPCID ) 40 MG tablet TAKE 1 TABLET BY MOUTH EVERY DAY 30 tablet 1   levothyroxine (SYNTHROID) 125 MCG tablet Take 125 mcg by mouth daily before breakfast.     pantoprazole (PROTONIX) 40 MG tablet Take 40 mg by mouth daily.     rosuvastatin (CRESTOR) 10 MG tablet Take 10 mg by mouth at bedtime.     ondansetron  (ZOFRAN ) 4 MG tablet Take 4 mg by mouth daily as needed.     WEGOVY 0.5 MG/0.5ML SOAJ Inject 0.5 mg into the skin once a week.     Current Facility-Administered Medications  Medication Dose Route Frequency Provider Last Rate Last Admin   0.9 %  sodium chloride  infusion  500 mL Intravenous Once Calani Gick, Lendon Queen, MD        Allergies as of 06/09/2024   (No Known Allergies)    Family History  Problem Relation Age of Onset  Uterine cancer Mother    Cancer - Lung Father    Skin cancer Father    Skin cancer Maternal Grandfather    Lung cancer Paternal Grandmother    Colon cancer Neg Hx    Esophageal cancer Neg Hx    Stomach cancer Neg Hx    Rectal cancer Neg Hx     Social History   Socioeconomic History   Marital status: Married    Spouse name: Not on file   Number of children: Not on file   Years of education: Not on file   Highest education level: Not on file  Occupational History   Not on file  Tobacco Use   Smoking status: Former    Current packs/day: 0.00    Types: Cigarettes    Quit date: 2010    Years since quitting: 15.4   Smokeless tobacco: Never  Vaping Use   Vaping status: Never  Used  Substance and Sexual Activity   Alcohol use: Yes    Comment: occassionally   Drug use: Never   Sexual activity: Not on file  Other Topics Concern   Not on file  Social History Narrative   Not on file   Social Drivers of Health   Financial Resource Strain: Not on file  Food Insecurity: Not on file  Transportation Needs: Not on file  Physical Activity: Not on file  Stress: Not on file  Social Connections: Unknown (02/21/2023)   Received from [facility]   Social Network    Social Network: Not on file  Intimate Partner Violence: Unknown (02/21/2023)   Received from [facility]   HITS    Physically Hurt: Not on file    Insult or Talk Down To: Not on file    Threaten Physical Harm: Not on file    Scream or Curse: Not on file    Review of Systems: All other review of systems negative except as mentioned in the HPI.  Physical Exam: Vital signs BP 110/70   Pulse 90   Temp (!) 97.3 F (36.3 C) (Temporal)   Ht 5' 7 (1.702 m)   Wt 199 lb (90.3 kg)   LMP  (LMP Unknown) Comment: last period 2012  SpO2 97%   BMI 31.17 kg/m   General:   Alert,  Well-developed, pleasant and cooperative in NAD Lungs:  Clear throughout to auscultation.   Heart:  Regular rate and rhythm Abdomen:  Soft, nontender and nondistended.   Neuro/Psych:  Alert and cooperative. Normal mood and affect. A and O x 3  Christi Coward, MD [facility]

## 2024-06-09 NOTE — Progress Notes (Signed)
VS completed by DT.  Pt's states no medical or surgical changes since previsit or office visit.  

## 2024-06-09 NOTE — Patient Instructions (Signed)

## 2024-06-09 NOTE — Progress Notes (Signed)
 Pt awake, alert and oriented. VSS. Airway intact. SBAR complete to RN. All questions answered.

## 2024-06-09 NOTE — Op Note (Signed)
 [facility] Patient Name: Leah Perry Procedure Date: 06/09/2024 9:37 AM MRN: [id number] Endoscopist: Landon Pinion P. General Kenner , MD, [id number] Age: 63 Referring MD:  Date of Birth: 11/13/1961 Gender: Female Account #: [id number] Procedure:                Colonoscopy Indications:              Screening for colorectal malignant neoplasm - last                            exam 2013 - hyperplastic polyps (at office visit it                            was thought last exam was in 2019, that is                            incorrect - confirmed with the patient - report is                            listed in 2019 is the date of when it was faxed,                            not performed) Medicines:                Monitored Anesthesia Care Procedure:                Pre-Anesthesia Assessment:                           - Prior to the procedure, a History and Physical                            was performed, and patient medications and                            allergies were reviewed. The patient's tolerance of                            previous anesthesia was also reviewed. The risks                            and benefits of the procedure and the sedation                            options and risks were discussed with the patient.                            All questions were answered, and informed consent                            was obtained. Prior Anticoagulants: The patient has                            taken no anticoagulant or antiplatelet agents. ASA  Grade Assessment: II - A patient with mild systemic                            disease. After reviewing the risks and benefits,                            the patient was deemed in satisfactory condition to                            undergo the procedure.                           After obtaining informed consent, the colonoscope                            was passed under direct vision. Throughout the                             procedure, the patient's blood pressure, pulse, and                            oxygen saturations were monitored continuously. The                            Olympus Scope [id number] was introduced through the                            anus and advanced to the the cecum, identified by                            appendiceal orifice and ileocecal valve. The                            colonoscopy was performed without difficulty. The                            patient tolerated the procedure well. The quality                            of the bowel preparation was good. The ileocecal                            valve, appendiceal orifice, and rectum were                            photographed. Scope In: 9:47:42 AM Scope Out: 10:08:35 AM Scope Withdrawal Time: 0 hours 18 minutes 42 seconds  Total Procedure Duration: 0 hours 20 minutes 53 seconds  Findings:                 The perianal and digital rectal examinations were                            normal.  A 5 to 6 mm polyp was found in the transverse                            colon. The polyp was sessile. The polyp was removed                            with a cold snare. Resection and retrieval were                            complete.                           A diminutive polyp was found in the sigmoid colon.                            The polyp was sessile. The polyp was removed with a                            cold snare. Resection and retrieval were complete.                           Multiple diverticula were found in the entire colon.                           Internal hemorrhoids were found during                            retroflexion. The hemorrhoids were small.                           The exam was otherwise without abnormality. Complications:            No immediate complications. Estimated blood loss:                            Minimal. Estimated Blood Loss:      Estimated blood loss was minimal. Impression:               - One 5 to 6 mm polyp in the transverse colon,                            removed with a cold snare. Resected and retrieved.                           - One diminutive polyp in the sigmoid colon,                            removed with a cold snare. Resected and retrieved.                           - Diverticulosis in the entire examined colon.                           - Internal hemorrhoids.                           -  The examination was otherwise normal. Recommendation:           - Patient has a contact number available for                            emergencies. The signs and symptoms of potential                            delayed complications were discussed with the                            patient. Return to normal activities tomorrow.                            Written discharge instructions were provided to the                            patient.                           - Resume previous diet.                           - Continue present medications.                           - Await pathology results. Landon Pinion P. Vikram Tillett, MD 06/09/2024 10:15:28 AM This report has been signed electronically.

## 2024-06-10 ENCOUNTER — Telehealth: Payer: Self-pay | Admitting: *Deleted

## 2024-06-10 NOTE — Telephone Encounter (Signed)
  Follow up Call-     06/09/2024    8:33 AM  Call back number  Post procedure Call Back phone  # [phone]  Permission to leave phone message Yes     Patient questions:  Do you have a fever, pain , or abdominal swelling? No. Pain Score  0 *  Have you tolerated food without any problems? Yes.    Have you been able to return to your normal activities? Yes.    Do you have any questions about your discharge instructions: Diet   No. Medications  No. Follow up visit  No.  Do you have questions or concerns about your Care? no  Actions: * If pain score is 4 or above: No action needed, pain <4.

## 2024-06-11 ENCOUNTER — Ambulatory Visit: Payer: Self-pay | Admitting: Gastroenterology

## 2024-06-11 LAB — SURGICAL PATHOLOGY

## 2024-08-09 ENCOUNTER — Other Ambulatory Visit: Payer: Self-pay | Admitting: Gastroenterology

## 2024-08-09 DIAGNOSIS — R142 Eructation: Secondary | ICD-10-CM
